# Patient Record
Sex: Female | Born: 1970 | Race: Asian | Hispanic: No | Marital: Married | Smoking: Never smoker
Health system: Southern US, Community
[De-identification: ages and names within clinical notes are randomized; demographics above are authoritative.]

## PROBLEM LIST (undated history)

## (undated) DIAGNOSIS — M797 Fibromyalgia: Secondary | ICD-10-CM

## (undated) DIAGNOSIS — R519 Headache, unspecified: Secondary | ICD-10-CM

## (undated) DIAGNOSIS — R51 Headache: Secondary | ICD-10-CM

## (undated) HISTORY — DX: Fibromyalgia: M79.7

## (undated) HISTORY — DX: Headache, unspecified: R51.9

## (undated) HISTORY — DX: Headache: R51

---

## 2013-11-16 ENCOUNTER — Other Ambulatory Visit: Payer: Self-pay

## 2013-11-16 DIAGNOSIS — Z1231 Encounter for screening mammogram for malignant neoplasm of breast: Secondary | ICD-10-CM

## 2013-12-02 ENCOUNTER — Ambulatory Visit
Admission: RE | Admit: 2013-12-02 | Discharge: 2013-12-02 | Disposition: A | Discharge status: Home / Self Care | Payer: PRIVATE HEALTH INSURANCE | Source: Ambulatory Visit

## 2013-12-02 DIAGNOSIS — Z1231 Encounter for screening mammogram for malignant neoplasm of breast: Secondary | ICD-10-CM

## 2014-01-03 DIAGNOSIS — G43909 Migraine, unspecified, not intractable, without status migrainosus: Secondary | ICD-10-CM | POA: Insufficient documentation

## 2014-01-03 DIAGNOSIS — IMO0002 Reserved for concepts with insufficient information to code with codable children: Secondary | ICD-10-CM | POA: Insufficient documentation

## 2014-04-14 ENCOUNTER — Other Ambulatory Visit: Payer: Self-pay | Admitting: Neurology

## 2014-04-14 ENCOUNTER — Encounter: Payer: Self-pay | Admitting: Neurology

## 2014-04-14 ENCOUNTER — Ambulatory Visit (INDEPENDENT_AMBULATORY_CARE_PROVIDER_SITE_OTHER): Payer: PRIVATE HEALTH INSURANCE | Admitting: Neurology

## 2014-04-14 VITALS — BP 108/66 | HR 78 | Resp 14 | Ht 60.0 in | Wt 123.0 lb

## 2014-04-14 DIAGNOSIS — F988 Other specified behavioral and emotional disorders with onset usually occurring in childhood and adolescence: Secondary | ICD-10-CM

## 2014-04-14 DIAGNOSIS — F909 Attention-deficit hyperactivity disorder, unspecified type: Secondary | ICD-10-CM

## 2014-04-14 DIAGNOSIS — R569 Unspecified convulsions: Secondary | ICD-10-CM

## 2014-04-14 DIAGNOSIS — G43109 Migraine with aura, not intractable, without status migrainosus: Secondary | ICD-10-CM

## 2014-04-14 DIAGNOSIS — F329 Major depressive disorder, single episode, unspecified: Secondary | ICD-10-CM

## 2014-04-14 DIAGNOSIS — F32A Depression, unspecified: Secondary | ICD-10-CM

## 2014-04-14 MED ORDER — ZONISAMIDE 100 MG PO CAPS: 100.0000 mg | ORAL_CAPSULE | Freq: Every day | ORAL | Status: DC

## 2014-04-14 MED ORDER — RIZATRIPTAN BENZOATE 10 MG PO TABS: 10.0000 mg | ORAL_TABLET | ORAL | Status: DC | PRN

## 2014-04-14 MED ORDER — FROVATRIPTAN SUCCINATE 2.5 MG PO TABS: ORAL_TABLET | ORAL | Status: DC

## 2014-04-14 NOTE — Procedures (Signed)
    History:  Maureen Moore is a 44 year old patient with a history of a witnessed seizure that occurred approximately 5 days prior to this evaluation. The episode was a generalized event. The patient had not gotten much rest prior to the seizure event. No prior history of seizures. MRI of the brain was unremarkable. The patient is being evaluated for the seizure episode.  This is a routine EEG. No skull defects are noted. Medications include baclofen, Zyrtec, dexmethylphenidate, Frova, Neurontin, Zofran, Maxalt, Zomig, and Zonegran. The patient had recently been on Wellbutrin.   EEG classification: Essentially normal awake  Description of the recording: The background rhythms of this recording consists of a fairly well modulated medium amplitude alpha rhythm of 10 Hz that is reactive to eye opening and closure. As the record progresses, the patient appears to remain in the waking state throughout the recording. Photic stimulation was performed, resulting in a bilateral and symmetric photic driving response. Hyperventilation was also performed, resulting in a minimal buildup of the background rhythm activities without significant slowing seen. There appears to be frequent head movement and muscle artifact, and some electrode artifact associated with movement. This is seen frequently throughout the recording. At no time during the recording does there appear to be evidence of spike or spike wave discharges or evidence of focal slowing. EKG monitor shows no evidence of cardiac rhythm abnormalities with a heart rate of 78.  Impression: This is an essentially normal EEG recording in the waking state. No evidence of ictal or interictal discharges are seen. The recording was technically difficult secondary to movement and muscle artifact. The patient was using her cell phone throughout the recording, creating artifact.

## 2014-04-14 NOTE — Progress Notes (Addendum)
[facility]  PATIENT: Maureen Moore DOB: 09/18/1970  REFERRING CLINICIAN: Brooke Bonito HISTORY FROM: patient REASON FOR VISIT: First seizure   HISTORICAL  CHIEF COMPLAINT:  Chief Complaint  Patient presents with  . Seizures    Husband sts. on 04-10-14 pt had first time sz like activity---full body jerking lasting approx 3 min. with confusion, lethargy afterwards.  He took her to [facility] for tx.  Sts. CT head, mri brain were negaitive.  No EEG done at that time.  She is on Topamax  bid.  She is also on Gabapentin  qhs./fim    HISTORY OF PRESENT ILLNESS:  Maureen Moore is a 44 year old woman who had the first seizure of her life about 5 days ago. She has slept poorly the previous night with about 4 or 4-1/2 hours split over a few segments. She was up about 4 hours when she had the seizure. Her husband heard a thud when she fell and went into the room. He witnessed her seizing for probably about 90 seconds. He described a generalized tonic-clonic seizure. There was no loss of bladder or bowel function. There was no tongue biting. Afterwards, she was groggy, confused and generally weak for a while. She went to [facility] emergency room. She had blood work which was essentially normal with just a mildly elevated white blood cell count.  A CT scan of the head and spine and an MRI of the head were all normal.  This was the first seizure of her life. Her son also has had one generalized tonic-clonic seizure. He has autism. There is no other family history of seizures.  She denies any birth injury or major head injury.   She denies any unusual activity or injury in the preceding week. She had noted over the preceding few months that she would be a little bit more confused and less focused,  getting lost at times while driving.  A month or two earlier, she had been on a higher dose of topiramate  that was being reduced due to cognitive fog as a side  effect. Gabapentin was added. She is also on Wellbutrin, though that has been tapered from 300 to 75 mg since the seizure. She was started on dexmethylphenidate a couple months ago.      Her attention deficit and cognitive issues and focus improved on dexmethylphenidate.  She has a history of frequent migraine headaches. She has been on multiple prophylactic medications for the migraines in the past. Topiramate at high dosages helped but she had difficulty tolerating it.  Keppra was not well tolerated.   She has not been on zonisamide or depakote.    Gabapentin has been added only 3 months ago at low dose.   She gets migraines for 4 or 5 days a month, usually clustered around her period. When headache occurs, it is usually unilateral. Sometimes gets visual aura.   She gets associated photophobia and phonophobia. Moving worsens the headache. Typically, she will take a Zomig. If that is not beneficial she takes a baclofen. At time she had used Tylenol 3 in the past. In the past, she was having rebound headaches when she took more of the Tylenol 3 so is trying to reserve it.    She has insomnia some nights and did not note much difference when gabapentin 300 mg was added.       REVIEW OF SYSTEMS:  Constitutional: No fevers, chills, sweats, or change in appetite Eyes: No visual changes,  double vision, eye pain Ear, nose and throat: No hearing loss, ear pain, nasal congestion, sore throat Cardiovascular: No chest pain, palpitations Respiratory:  No shortness of breath at rest or with exertion.   No wheezes GastrointestinaI: No nausea, vomiting, diarrhea, abdominal pain, fecal incontinence Genitourinary:  No dysuria, urinary retention or frequency.  No nocturia. Musculoskeletal:  No neck pain, back pain Integumentary: No rash, pruritus, skin lesions.  Photosensitive to sulfa drugs.    Neurological: as above Psychiatric: Mood was doing well on wellbutrin. Endocrine: No palpitations, diaphoresis,  change in appetite, change in weigh or increased thirst Hematologic/Lymphatic:  No anemia, purpura, petechiae. Allergic/Immunologic: She has seasonal allerigies.   No rashes  ALLERGIES: Allergies  Allergen Reactions  . Sulfa Antibiotics     HOME MEDICATIONS:  Current outpatient prescriptions:  .  acetaminophen-codeine (TYLENOL #3) 300-30 MG per tablet, Take by mouth., Disp: , Rfl:  .  baclofen (LIORESAL) 10 MG tablet, , Disp: , Rfl: 0 .  buPROPion (WELLBUTRIN) 75 MG tablet, , Disp: , Rfl: 0 .  cetirizine (ZYRTEC) 10 MG tablet, Take by mouth., Disp: , Rfl:  .  Dexmethylphenidate HCl 40 MG CP24, , Disp: , Rfl: 0 .  gabapentin (NEURONTIN) 300 MG capsule, , Disp: , Rfl: 2 .  neomycin-polymyxin-gramicidin (NEOSPORIN) 1.75-10000-.025 ophthalmic solution, , Disp: , Rfl: 0 .  ondansetron (ZOFRAN-ODT) 4 MG disintegrating tablet, , Disp: , Rfl: 1 .  rizatriptan (MAXALT) 10 MG tablet, Take by mouth., Disp: , Rfl:  .  topiramate (TOPAMAX) 25 MG tablet, Take by mouth., Disp: , Rfl:  .  XULANE 150-35 MCG/24HR transdermal patch, , Disp: , Rfl: 13 .  ZOMIG 5 MG nasal solution, , Disp: , Rfl: 2   PAST MEDICAL HISTORY: Past Medical History  Diagnosis Date  . Headache   . Fibromyalgia     PAST SURGICAL HISTORY: History reviewed. No pertinent past surgical history.  FAMILY HISTORY: History reviewed. No pertinent family history. Son had one seizure  SOCIAL HISTORY:  History   Social History  . Marital Status: Married    Spouse Name: N/A    Number of Children: N/A  . Years of Education: N/A   Occupational History  . Not on file.   Social History Main Topics  . Smoking status: Never Smoker   . Smokeless tobacco: Not on file  . Alcohol Use: 0.0 oz/week    0 Not specified per week     Comment: social  . Drug Use: No  . Sexual Activity: Not on file   Other Topics Concern  . Not on file   Social History Narrative  . No narrative on file     PHYSICAL EXAM  Filed Vitals:     04/14/14 0938  BP: 108/66  Pulse: 78  Resp: 14  Height: 5' (1.524 m)  Weight: 123 lb (55.792 kg)    Body mass index is 24.02 kg/(m^2).   General: The patient is well-developed and well-nourished and in no acute distress  Eyes:  Funduscopic exam shows normal optic discs and retinal vessels.  Neck: The neck is supple, no carotid bruits are noted.  The neck is nontender.  Respiratory: The respiratory examination is clear.  Cardiovascular: The cardiovascular examination reveals a regular rate and rhythm, no murmurs, gallops or rubs are noted.  Skin: Extremities are without significant edema.  Neurologic Exam  Mental status: The patient is alert and oriented x 3 at the time of the examination. The patient has apparent normal recent and remote memory,  with an apparently normal attention span and concentration ability.   Speech is normal.  Cranial nerves: Extraocular movements are full. Pupils are equal, round, and reactive to light and accomodation.  Visual fields are full.  Facial symmetry is present. There is good facial sensation to soft touch bilaterally.Facial strength is normal.  Trapezius and sternocleidomastoid strength is normal. No dysarthria is noted.  The tongue is midline, and the patient has symmetric elevation of the soft palate. No obvious hearing deficits are noted.  Motor:  Muscle bulk and tone are normal. Strength is  5 / 5 in all 4 extremities.   Sensory: Sensory testing is intact to pinprick, soft touch, vibration sensation, and position sense on all 4 extremities.  Coordination: Cerebellar testing reveals good finger-nose-finger and heel-to-shin bilaterally.  Gait and station: Station and gait are normal. Tandem gait is normal. Romberg is negative.   Reflexes: Deep tendon reflexes are symmetric and normally brisk bilaterally. Plantar responses are normal.    DIAGNOSTIC DATA (LABS, IMAGING, TESTING) - I reviewed patient records, labs, notes, testing and  imaging myself where available.  EEG performed today showed no definite epileptiform activity.   She had movement artifact.    ASSESSMENT AND PLAN   Convulsions, unspecified convulsion type  Attention deficit disorder  Depression  Migraine with aura and without status migrainosus, not intractable   In summary, Maureen Moore is a 44 year old woman who had the first seizure of her life 5 days ago and is completely back to baseline. Her seizure occurred in the setting of 300 mg of daily Wellbutrin and tapering topiramate and possibly mild sleep deprivation. EEG today was normal. We discussed that it is very possible that she would never have another seizure as this one may have been triggered by the medications and poor sleep. All of her, we cannot be certain and therefore she should drive for several months. I have asked her to stop the Wellbutrin. I will add is zonisamide as it is often similar to Topamax in efficacy but better as far as attentional tolerability. I would also make some changes in her migraine medications. Her migraines are clustered around her period. She might benefit from taking several days in a row of Frova after her first migraine each month. She is not to take more than 2 of any triptan in one day.  She will return to see me in 3 months or sooner if she has new or worsening neurologic issues.   Marry Kusch A. Epimenio Foot, MD, PhD 04/14/2014, 9:57 AM Certified in Neurology, Clinical Neurophysiology, Sleep Medicine, Pain Medicine and Neuroimaging  [facility] [address], [address] [place], [place] [address] [phone]

## 2014-04-14 NOTE — Telephone Encounter (Signed)
Per OV note, 100mg  daily was sent

## 2014-04-21 ENCOUNTER — Telehealth: Payer: Self-pay | Admitting: Neurology

## 2014-04-21 ENCOUNTER — Other Ambulatory Visit: Payer: Self-pay | Admitting: Neurology

## 2014-04-21 MED ORDER — TOPIRAMATE 50 MG PO TABS: 50.0000 mg | ORAL_TABLET | Freq: Two times a day (BID) | ORAL | Status: DC

## 2014-04-21 NOTE — Telephone Encounter (Signed)
Having itching with zonisamide.  We will switch to Topamax.  If foggy feeling, change to  Trokendi

## 2014-05-03 ENCOUNTER — Telehealth: Payer: Self-pay | Admitting: Neurology

## 2014-05-03 ENCOUNTER — Other Ambulatory Visit: Payer: Self-pay | Admitting: Neurology

## 2014-05-03 MED ORDER — TOPIRAMATE 100 MG PO TABS: 100.0000 mg | ORAL_TABLET | Freq: Three times a day (TID) | ORAL | Status: DC

## 2014-05-03 NOTE — Telephone Encounter (Signed)
Per note on 02/11

## 2014-05-03 NOTE — Telephone Encounter (Signed)
1.  HA's not doing well on TPM 50 mg po bid -- used to be on 300 mg/day     ---   I will increase 2.   Mood worse on citalopram than wellbutrin   --- consider change to zoloft but rec'd discuss with her psychiatrist

## 2014-05-18 ENCOUNTER — Ambulatory Visit (INDEPENDENT_AMBULATORY_CARE_PROVIDER_SITE_OTHER): Payer: PRIVATE HEALTH INSURANCE | Admitting: Neurology

## 2014-05-18 ENCOUNTER — Encounter: Payer: Self-pay | Admitting: Neurology

## 2014-05-18 VITALS — BP 98/66 | HR 68 | Resp 14 | Ht 60.0 in | Wt 123.6 lb

## 2014-05-18 DIAGNOSIS — G43109 Migraine with aura, not intractable, without status migrainosus: Secondary | ICD-10-CM | POA: Diagnosis not present

## 2014-05-18 DIAGNOSIS — G4700 Insomnia, unspecified: Secondary | ICD-10-CM

## 2014-05-18 DIAGNOSIS — M797 Fibromyalgia: Secondary | ICD-10-CM | POA: Diagnosis not present

## 2014-05-18 DIAGNOSIS — R569 Unspecified convulsions: Secondary | ICD-10-CM

## 2014-05-18 DIAGNOSIS — F32A Depression, unspecified: Secondary | ICD-10-CM

## 2014-05-18 DIAGNOSIS — F329 Major depressive disorder, single episode, unspecified: Secondary | ICD-10-CM | POA: Diagnosis not present

## 2014-05-18 DIAGNOSIS — F988 Other specified behavioral and emotional disorders with onset usually occurring in childhood and adolescence: Secondary | ICD-10-CM

## 2014-05-18 DIAGNOSIS — F909 Attention-deficit hyperactivity disorder, unspecified type: Secondary | ICD-10-CM

## 2014-05-18 MED ORDER — GABAPENTIN 300 MG PO CAPS: ORAL_CAPSULE | ORAL | Status: DC

## 2014-05-18 MED ORDER — VENLAFAXINE HCL ER 150 MG PO CP24: 150.0000 mg | ORAL_CAPSULE | Freq: Every day | ORAL | Status: DC

## 2014-05-18 NOTE — Progress Notes (Signed)
[facility]  PATIENT: Maureen Moore DOB: 01-02-1971  REFERRING CLINICIAN: Brooke Bonito HISTORY FROM: patient REASON FOR VISIT: First seizure   HISTORICAL  CHIEF COMPLAINT:  Chief Complaint  Patient presents with  . Migraine    Sts. h/a'a are no better.  Sts. she is having more joint pain--mainly in shoulders, hands, elbows, hips./fim    HISTORY OF PRESENT ILLNESS:  Maureen Moore is a 44 year old woman with a single seizure 04/10/2014 and migraine headaches.  Seizure:   On 04/10/2014, she had a GTC seizure witnessed by her husband, an MD.  The seizure 04/10/2014 was in the setting of reducing topiramate dose and being on Wellbutrin and being partially sleep deprived. There was no loss of bladder or bowel function. There was no tongue biting. Afterwards, she was groggy, confused and generally weak for a while.   CT and MRI 04/10/2014 were normal. An EEG last month was normal.  ADD:   Her attention deficit and cognitive issues and focus improved on dexmethylphenidate.   She stopped after the seizure  Migraine:   She has a history of frequent migraine headaches.  She gets migraines for 4 or 5 days a month, usually clustered around her period. When headache occurs, it is usually unilateral. Sometimes gets visual aura.   She gets associated photophobia and phonophobia. Moving worsens the headache. Typically, she will take a Zomig. If that is not beneficial she takes a baclofen.   She has been on multiple prophylactic medications for the migraines in the past. Topiramate at high dosages helped but she had difficulty tolerating it.  Keppra was not well tolerated.  I had her try zonisamide but it caused a rash so she was placed back on topiramate and titrated up to 300 mg daily.       Insomnia:   She has insomnia some nights and did not note much difference when gabapentin 300 mg was added.    She has trouble falling asleep and often has a hypnic start right as she falls  asleep,    She also has nocturia.  She talks in her sleep.     She does not snore.     Fibromyalgia:   She has had more myalgias over the past few months.   Pain is fairly symmetric and located in upper back, chest, arms and legs.   Joints seem stiff, especially the hands and left elbow and shoulders.    She has never been told she has FMS but has had pain like this off/on x many years.    About 13 years ago, she had similar pain and was placed in an antidepressant with improvement (citalopram).  After a while, pain worsened again but then improved after changes in antidepressant.     REVIEW OF SYSTEMS:  Constitutional: No fevers, chills, sweats, or change in appetite.   Eyes: No visual changes, double vision, eye pain Ear, nose and throat: No hearing loss, ear pain, nasal congestion, sore throat Cardiovascular: No chest pain, palpitations Respiratory:  Spome coughing and mild shortness of breath.   No wheezes GastrointestinaI: No nausea, vomiting, diarrhea, abdominal pain, fecal incontinence Genitourinary:  No dysuria, urinary retention or frequency.  She notes nocturia. Musculoskeletal:  Pain in neck, back and many large muscles and some joints Integumentary: No rash, pruritus, skin lesions.  Photosensitive to sulfa drugs.    Neurological: as above Psychiatric: Some depression.   Better on citalopram. Endocrine: No palpitations, diaphoresis, change in appetite, some cold intolerance Hematologic/Lymphatic:  No anemia, purpura, petechiae. Allergic/Immunologic: She has seasonal allerigies.   No rashes  ALLERGIES: Allergies  Allergen Reactions  . Sulfa Antibiotics     HOME MEDICATIONS:  Current outpatient prescriptions:  .  albuterol (PROVENTIL HFA;VENTOLIN HFA) 108 (90 BASE) MCG/ACT inhaler, Inhale into the lungs every 6 (six) hours as needed for wheezing or shortness of breath., Disp: , Rfl:  .  citalopram (CELEXA) 20 MG tablet, Take 20 mg by mouth 3 (three) times daily., Disp: , Rfl:    .  fexofenadine (ALLEGRA) 30 MG tablet, Take 30 mg by mouth 2 (two) times daily., Disp: , Rfl:  .  fluticasone (FLONASE) 50 MCG/ACT nasal spray, Place into both nostrils daily., Disp: , Rfl:  .  ondansetron (ZOFRAN-ODT) 4 MG disintegrating tablet, , Disp: , Rfl: 1 .  rizatriptan (MAXALT) 10 MG tablet, Take 1 tablet (10 mg total) by mouth as needed for migraine., Disp: 10 tablet, Rfl: 11 .  topiramate (TOPAMAX) 100 MG tablet, TAKE 1 TABLET BY MOUTH THREE TIMES DAILY, Disp: 270 tablet, Rfl: 1 .  XULANE 150-35 MCG/24HR transdermal patch, , Disp: , Rfl: 13 .  ZOMIG 5 MG nasal solution, , Disp: , Rfl: 2 .  acetaminophen-codeine (TYLENOL #3) 300-30 MG per tablet, Take by mouth., Disp: , Rfl:  .  baclofen (LIORESAL) 10 MG tablet, , Disp: , Rfl: 0 .  cetirizine (ZYRTEC) 10 MG tablet, Take by mouth., Disp: , Rfl:  .  Dexmethylphenidate HCl 40 MG CP24, , Disp: , Rfl: 0 .  frovatriptan (FROVA) 2.5 MG tablet, Take po at onset of migraines and daily x 5 more days to avoid rebound (Patient not taking: Reported on 05/18/2014), Disp: 10 tablet, Rfl: 0 .  gabapentin (NEURONTIN) 300 MG capsule, , Disp: , Rfl: 2 .  neomycin-polymyxin-gramicidin (NEOSPORIN) 1.75-10000-.025 ophthalmic solution, , Disp: , Rfl: 0   PAST MEDICAL HISTORY: Past Medical History  Diagnosis Date  . Headache   . Fibromyalgia     PAST SURGICAL HISTORY: History reviewed. No pertinent past surgical history.  FAMILY HISTORY: History reviewed. No pertinent family history. Son had one seizure  SOCIAL HISTORY:  History   Social History  . Marital Status: Married    Spouse Name: N/A  . Number of Children: N/A  . Years of Education: N/A   Occupational History  . Not on file.   Social History Main Topics  . Smoking status: Never Smoker   . Smokeless tobacco: Not on file  . Alcohol Use: 0.0 oz/week    0 Standard drinks or equivalent per week     Comment: social  . Drug Use: No  . Sexual Activity: Not on file   Other  Topics Concern  . Not on file   Social History Narrative     PHYSICAL EXAM  Filed Vitals:   05/18/14 1319  BP: 98/66  Pulse: 68  Resp: 14  Height: 5' (1.524 m)  Weight: 123 lb 9.6 oz (56.065 kg)    Body mass index is 24.14 kg/(m^2).   General: The patient is well-developed and well-nourished and in no acute distress  Neck/Musculoskeletal: The neck is supple, no carotid bruits are noted.  The neck is mildly ttender in paraspinal muscles and trapezius muscles.  Also tender over sibacromila bursae in shoulders, pectoralis muslces, prox arm and leg muscles and trochanteric bursae  Skin: Extremities are without significant edema.  Neurologic Exam  Mental status: The patient is alert and oriented x 3 at the time of the examination. The patient  has apparent normal recent and remote memory, with an apparently normal attention span and concentration ability.   Speech is normal.  Cranial nerves: Extraocular movements are full.  Facial symmetry is present. There is good facial sensation to soft touch bilaterally.Facial strength is normal.  Trapezius and sternocleidomastoid strength is normal. No dysarthria is noted.  The tongue is midline, and the patient has symmetric elevation of the soft palate. No obvious hearing deficits are noted.  Motor:  Muscle bulk and tone are normal. Strength is  5 / 5 in all 4 extremities.   Sensory: Sensory testing is intact to touch on all 4 extremities.  Coordination: Cerebellar testing reveals good finger-nose-finger.  Gait and station: Station and gait are normal. Tandem gait is normal.   Reflexes: Deep tendon reflexes are symmetric and normally brisk bilaterally.     DIAGNOSTIC DATA (LABS, IMAGING, TESTING) - I reviewed patient records, labs, notes, testing and imaging myself where available.  EEG performed today showed no definite epileptiform activity.   She had movement artifact.    ASSESSMENT AND PLAN   Migraine with aura and without  status migrainosus, not intractable  Convulsions, unspecified convulsion type  Attention deficit disorder  Depression  Insomnia  Fibromyalgia   1.   Continue Topiramate.   If she notes any speech issues/mental fog, change to Trokendi as often better tolerated form of topiramate 2.   We discussed that her combination of symptoms might also represent fibromyalagia --  Gabapentin 300-300-600 (less weight gain than Lyrica) and change citalopram to Effexor 3.   Try to get at least 6 hours sleep nightly Return to clinic in 3 months or sooner if she has new or worsening neurologic symptoms.  In summary, Ayari Calix is a 44 year old woman who had the first seizure of her life 5 days ago and is completely back to baseline. Her seizure occurred in the setting of 300 mg of daily Wellbutrin and tapering topiramate and possibly mild sleep deprivation. EEG today was normal. We discussed that it is very possible that she would never have another seizure as this one may have been triggered by the medications and poor sleep. All of her, we cannot be certain and therefore she should drive for several months. I have asked her to stop the Wellbutrin. I will add is zonisamide as it is often similar to Topamax in efficacy but better as far as attentional tolerability. I would also make some changes in her migraine medications. Her migraines are clustered around her period. She might benefit from taking several days in a row of Frova after her first migraine each month. She is not to take more than 2 of any triptan in one day.  She will return to see me in 3 months or sooner if she has new or worsening neurologic issues.   Saleen Peden A. Epimenio Foot, MD, PhD 05/18/2014, 1:37 PM Certified in Neurology, Clinical Neurophysiology, Sleep Medicine, Pain Medicine and Neuroimaging  [facility] [address] [place], [place] [address] [phone]

## 2014-05-25 ENCOUNTER — Telehealth: Payer: Self-pay | Admitting: Neurology

## 2014-05-25 MED ORDER — GABAPENTIN 300 MG PO CAPS: ORAL_CAPSULE | ORAL | Status: DC

## 2014-05-25 MED ORDER — VENLAFAXINE HCL ER 150 MG PO CP24: 150.0000 mg | ORAL_CAPSULE | Freq: Every day | ORAL | Status: DC

## 2014-05-25 NOTE — Telephone Encounter (Signed)
[facility] Batheresa [facility] [address] [facility] calling to get new Rx's for gabapentin (NEURONTIN) 300 MG capsule and venlafaxine XR (EFFEXOR-XR) 150 MG 24 hr capsule for the patient. The pharmacy states Rx's were sent to the mail order pharmacy but they never received it. Please send to [facility] on [address] Thank you.

## 2014-05-25 NOTE — Telephone Encounter (Signed)
Rx's have been sent. 

## 2014-07-13 ENCOUNTER — Ambulatory Visit: Payer: Self-pay | Admitting: Neurology

## 2014-08-16 ENCOUNTER — Ambulatory Visit: Payer: Self-pay | Admitting: Neurology

## 2014-08-18 ENCOUNTER — Ambulatory Visit (INDEPENDENT_AMBULATORY_CARE_PROVIDER_SITE_OTHER): Payer: PRIVATE HEALTH INSURANCE | Admitting: Neurology

## 2014-08-18 ENCOUNTER — Encounter: Payer: Self-pay | Admitting: Neurology

## 2014-08-18 VITALS — BP 114/76 | HR 72 | Resp 12 | Ht 60.0 in | Wt 122.6 lb

## 2014-08-18 DIAGNOSIS — G43109 Migraine with aura, not intractable, without status migrainosus: Secondary | ICD-10-CM

## 2014-08-18 DIAGNOSIS — G4700 Insomnia, unspecified: Secondary | ICD-10-CM | POA: Diagnosis not present

## 2014-08-18 DIAGNOSIS — F988 Other specified behavioral and emotional disorders with onset usually occurring in childhood and adolescence: Secondary | ICD-10-CM

## 2014-08-18 DIAGNOSIS — R569 Unspecified convulsions: Secondary | ICD-10-CM

## 2014-08-18 DIAGNOSIS — F329 Major depressive disorder, single episode, unspecified: Secondary | ICD-10-CM

## 2014-08-18 DIAGNOSIS — M797 Fibromyalgia: Secondary | ICD-10-CM | POA: Diagnosis not present

## 2014-08-18 DIAGNOSIS — F909 Attention-deficit hyperactivity disorder, unspecified type: Secondary | ICD-10-CM | POA: Diagnosis not present

## 2014-08-18 DIAGNOSIS — F32A Depression, unspecified: Secondary | ICD-10-CM

## 2014-08-18 MED ORDER — METHYLPHENIDATE HCL ER 36 MG PO TB24: 36.0000 mg | ORAL_TABLET | Freq: Every day | ORAL | Status: DC

## 2014-08-18 NOTE — Progress Notes (Signed)
[facility]  PATIENT: Maureen Moore DOB: Aug 13, 1970  REFERRING CLINICIAN: Brooke Bonito HISTORY FROM: patient REASON FOR VISIT: First seizure   HISTORICAL  CHIEF COMPLAINT:  Chief Complaint  Patient presents with  . Headaches    Sts. h/a's are overall better.  Sts. joint pain related to fibromyalgia is much improved since Effexor was added at last ov.  Sts. she is sleeping much better since Gabapentin was added at lsat ov.  Today she would like to discuss Topamax--she feels it is exacerbating ADD and would like to discuss switching to something else/fim  . Fibromyalgia    HISTORY OF PRESENT ILLNESS:  Maureen Moore is a 44 year old woman with a single seizure 04/10/2014 and migraine headaches who also has ADD, insomnia and myalgias  Migraine:   She is doing better on topiramate with only 4 or 5 days a month now.   She had a couple this week while out in the sun all day. They are usually clustered around her period. When headache occurs, they are unilateral and she sometimes gets visual aura.   She gets associated photophobia and phonophobia. Moving worsens the headache. Typically, she will take a Zomig.   She has been on multiple prophylactic medications for the migraines in the past. Topiramate has helped the most.   Keppra was not well tolerated.Zonisamide caused a rash.    Seizure:   She had the single seizure in January and has not had any further convulsions.  The seizure 04/10/2014 was in the setting of reducing topiramate dose and being on Wellbutrin and being partially sleep deprived.  CT and MRI 04/10/2014 were normal. An EEG last month was normal.  ADD:   Her attention deficit and cognitive issues and focus improved on dexmethylphenidate and Concerta but she stopped after the seizure.   She feels the ADD is much worse without a stimulant and wonders if she can go back on one.     Insomnia:   Insomnia is doing better and she feels she is sleeping well.         She talks in her sleep.     She does not snore.     Fibromyalgia?  :   She had myalgias but they improved with Gabapentin/Effexor.   Pain is symmetric and located in upper back, chest, arms and legs.        REVIEW OF SYSTEMS:  Constitutional: No fevers, chills, sweats, or change in appetite.   Eyes: No visual changes, double vision, eye pain Ear, nose and throat: No hearing loss, ear pain, nasal congestion, sore throat Cardiovascular: No chest pain, palpitations Respiratory:  Spome coughing and mild shortness of breath.   No wheezes GastrointestinaI: No nausea, vomiting, diarrhea, abdominal pain, fecal incontinence Genitourinary:  No dysuria, urinary retention or frequency.  She notes nocturia. Musculoskeletal:  Pain in neck, back and many large muscles and some joints Integumentary: No rash, pruritus, skin lesions.  Photosensitive to sulfa drugs.    Neurological: as above Psychiatric: Some depression.   Better on citalopram. Endocrine: No palpitations, diaphoresis, change in appetite, some cold intolerance Hematologic/Lymphatic:  No anemia, purpura, petechiae. Allergic/Immunologic: She has seasonal allerigies.   No rashes  ALLERGIES: Allergies  Allergen Reactions  . Sulfa Antibiotics     HOME MEDICATIONS:  Current outpatient prescriptions:  .  albuterol (PROVENTIL HFA;VENTOLIN HFA) 108 (90 BASE) MCG/ACT inhaler, Inhale into the lungs every 6 (six) hours as needed for wheezing or shortness of breath., Disp: , Rfl:  .  fluticasone (FLONASE) 50 MCG/ACT nasal spray, Place into both nostrils daily., Disp: , Rfl:  .  gabapentin (NEURONTIN) 300 MG capsule, One in am, one in evening and two at bedtime, Disp: 120 capsule, Rfl: 5 .  neomycin-polymyxin-gramicidin (NEOSPORIN) 1.75-10000-.025 ophthalmic solution, , Disp: , Rfl: 0 .  ondansetron (ZOFRAN-ODT) 4 MG disintegrating tablet, , Disp: , Rfl: 1 .  rizatriptan (MAXALT) 10 MG tablet, Take 1 tablet (10 mg total) by mouth as needed for  migraine., Disp: 10 tablet, Rfl: 11 .  topiramate (TOPAMAX) 100 MG tablet, TAKE 1 TABLET BY MOUTH THREE TIMES DAILY, Disp: 270 tablet, Rfl: 1 .  venlafaxine XR (EFFEXOR-XR) 150 MG 24 hr capsule, Take 1 capsule (150 mg total) by mouth daily with breakfast., Disp: 30 capsule, Rfl: 5 .  ZOMIG 5 MG nasal solution, , Disp: , Rfl: 2 .  fexofenadine (ALLEGRA) 30 MG tablet, Take 30 mg by mouth 2 (two) times daily., Disp: , Rfl:  .  XULANE 150-35 MCG/24HR transdermal patch, , Disp: , Rfl: 13   PAST MEDICAL HISTORY: Past Medical History  Diagnosis Date  . Headache   . Fibromyalgia     PAST SURGICAL HISTORY: History reviewed. No pertinent past surgical history.  FAMILY HISTORY: History reviewed. No pertinent family history. Son had one seizure  SOCIAL HISTORY:  History   Social History  . Marital Status: Married    Spouse Name: N/A  . Number of Children: N/A  . Years of Education: N/A   Occupational History  . Not on file.   Social History Main Topics  . Smoking status: Never Smoker   . Smokeless tobacco: Not on file  . Alcohol Use: 0.0 oz/week    0 Standard drinks or equivalent per week     Comment: social  . Drug Use: No  . Sexual Activity: Not on file   Other Topics Concern  . Not on file   Social History Narrative     PHYSICAL EXAM  Filed Vitals:   08/18/14 1133  BP: 114/76  Pulse: 72  Resp: 12  Height: 5' (1.524 m)  Weight: 122 lb 9.6 oz (55.611 kg)    Body mass index is 23.94 kg/(m^2).   General: The patient is well-developed and well-nourished and in no acute distress  Neck/Musculoskeletal: The neck is supple,  The neck is mildly tender in the paraspinal muscles and trapezius muscles.  Also tender over  pectoralis muslces, prox arm and leg muscles and trochanteric bursae    Neurologic Exam  Mental status: The patient is alert and oriented x 3 at the time of the examination. The patient has apparent normal recent and remote memory, with an apparently  normal attention span and concentration ability.   Speech is normal.  Cranial nerves: Extraocular movements are full.  Facial symmetry is present. Facial strength is normal.  Trapezius and sternocleidomastoid strength is normal. No dysarthria is noted.  The tongue is midline, and the patient has symmetric elevation of the soft palate. No obvious hearing deficits are noted.  Motor:  Muscle bulk and tone are normal. Strength is  5 / 5 in all 4 extremities.   Sensory: Sensory testing is intact to touch on all 4 extremities.  Coordination: Cerebellar testing reveals good finger-nose-finger.  Gait and station: Station and gait are normal. Tandem gait is normal.   Reflexes: Deep tendon reflexes are symmetric and normally brisk bilaterally.     DIAGNOSTIC DATA (LABS, IMAGING, TESTING) - I reviewed patient records, labs, notes, testing and  imaging myself where available.    ASSESSMENT AND PLAN   Migraine with aura and without status migrainosus, not intractable  Convulsions, unspecified convulsion type  Attention deficit disorder  Insomnia  Fibromyalgia  Depression    1.   Continue Topiramate.   If she notes any speech issues/mental fog, consider change to Trokendi  2.   Continue gabapentin and Effexor for pain and sleep.     Try to get at least 6 hours sleep nightly 3.  Concerta 36 mg daily for ADD.  I think the risk of seizures would be rather low as she got on Wellbutrin but is on Topamax  Return to clinic in 4 months or sooner if she has new or worsening neurologic symptoms.    Lamyiah Crawshaw A. Epimenio Foot, MD, PhD 08/18/2014, 11:39 AM Certified in Neurology, Clinical Neurophysiology, Sleep Medicine, Pain Medicine and Neuroimaging  [facility] [address], [address] [place], [place] [address] [phone]

## 2014-09-30 ENCOUNTER — Encounter: Payer: Self-pay | Admitting: *Deleted

## 2014-09-30 ENCOUNTER — Other Ambulatory Visit: Payer: Self-pay | Admitting: Neurology

## 2014-09-30 MED ORDER — METHYLPHENIDATE HCL ER 36 MG PO TB24: 36.0000 mg | ORAL_TABLET | Freq: Every day | ORAL | Status: DC

## 2014-09-30 NOTE — Telephone Encounter (Signed)
Request entered, forwarded to provider for approval.  

## 2014-09-30 NOTE — Progress Notes (Signed)
Ritalin rx. up front [facility]/fim 

## 2014-09-30 NOTE — Telephone Encounter (Signed)
Patient called and requested written rx. methylphenidate 36 MG PO CR tablet.. Would like to know if she can be written two? Please call and advise.

## 2014-11-16 ENCOUNTER — Telehealth: Payer: Self-pay | Admitting: Neurology

## 2014-11-16 NOTE — Telephone Encounter (Signed)
Clinical info has been sent to ins.  Request is under review Ref # [id number]

## 2014-11-16 NOTE — Telephone Encounter (Signed)
Patient called requesting that PA be done on Methylphenidate that she just recently had filled. Rx costs $250. Patient was advised by pharmacy that this medication needed PA. If Dr. Epimenio Foot does PA on medication and pharmacy is notified then they can reimburse patient.

## 2014-11-17 NOTE — Telephone Encounter (Signed)
Ins sent a response stating the patient's policy has been terminated.  I called the patient to clarify her current ins info.  Says she forgot to mention that her ins has changed.  She will obtain a copy of the new card and forward this info to [place].  Advised we will contact current ins once we have this info.  She expressed understanding.

## 2014-11-20 NOTE — Telephone Encounter (Signed)
Patient's new Ins [employer] (serviced by [employer]) has approved the request for coverage on Methylphenidate effective until 11/17/2015 Ref ID # [id number] Ref Case # [id number].

## 2014-11-21 ENCOUNTER — Other Ambulatory Visit: Payer: Self-pay | Admitting: Neurology

## 2014-12-01 ENCOUNTER — Telehealth: Payer: Self-pay | Admitting: Neurology

## 2014-12-01 MED ORDER — METHYLPHENIDATE HCL ER 36 MG PO TB24: 36.0000 mg | ORAL_TABLET | Freq: Every day | ORAL | Status: DC

## 2014-12-01 NOTE — Telephone Encounter (Signed)
Patient called to request refill of methylphenidate 36 MG PO CR tablet. Patient would like to get 2 or 3 Rx's of this at a time (1 for now, 2 postdated).

## 2014-12-01 NOTE — Telephone Encounter (Signed)
Per RAS ok, rx. for now plus 2 postdated rx's for October, November, printed, signed, up front [facility]/fim

## 2014-12-02 ENCOUNTER — Other Ambulatory Visit: Payer: Self-pay

## 2014-12-02 DIAGNOSIS — Z1231 Encounter for screening mammogram for malignant neoplasm of breast: Secondary | ICD-10-CM

## 2014-12-13 ENCOUNTER — Ambulatory Visit
Admission: RE | Admit: 2014-12-13 | Discharge: 2014-12-13 | Disposition: A | Discharge status: Home / Self Care | Payer: PRIVATE HEALTH INSURANCE | Source: Ambulatory Visit

## 2014-12-13 DIAGNOSIS — Z1231 Encounter for screening mammogram for malignant neoplasm of breast: Secondary | ICD-10-CM

## 2014-12-16 ENCOUNTER — Other Ambulatory Visit: Payer: Self-pay | Admitting: Neurology

## 2014-12-19 ENCOUNTER — Encounter: Payer: Self-pay | Admitting: Neurology

## 2014-12-19 ENCOUNTER — Ambulatory Visit (INDEPENDENT_AMBULATORY_CARE_PROVIDER_SITE_OTHER): Payer: PRIVATE HEALTH INSURANCE | Admitting: Neurology

## 2014-12-19 VITALS — BP 116/82 | HR 66 | Resp 12 | Ht 60.0 in | Wt 123.2 lb

## 2014-12-19 DIAGNOSIS — F988 Other specified behavioral and emotional disorders with onset usually occurring in childhood and adolescence: Secondary | ICD-10-CM

## 2014-12-19 DIAGNOSIS — F32A Depression, unspecified: Secondary | ICD-10-CM

## 2014-12-19 DIAGNOSIS — F909 Attention-deficit hyperactivity disorder, unspecified type: Secondary | ICD-10-CM

## 2014-12-19 DIAGNOSIS — M797 Fibromyalgia: Secondary | ICD-10-CM | POA: Diagnosis not present

## 2014-12-19 DIAGNOSIS — G43109 Migraine with aura, not intractable, without status migrainosus: Secondary | ICD-10-CM | POA: Diagnosis not present

## 2014-12-19 DIAGNOSIS — G4700 Insomnia, unspecified: Secondary | ICD-10-CM

## 2014-12-19 DIAGNOSIS — R569 Unspecified convulsions: Secondary | ICD-10-CM

## 2014-12-19 DIAGNOSIS — F329 Major depressive disorder, single episode, unspecified: Secondary | ICD-10-CM

## 2014-12-19 MED ORDER — GABAPENTIN 300 MG PO CAPS: ORAL_CAPSULE | ORAL | Status: DC

## 2014-12-19 MED ORDER — TOPIRAMATE 100 MG PO TABS: 100.0000 mg | ORAL_TABLET | Freq: Three times a day (TID) | ORAL | Status: DC

## 2014-12-19 MED ORDER — RIZATRIPTAN BENZOATE 10 MG PO TABS: 10.0000 mg | ORAL_TABLET | ORAL | Status: DC | PRN

## 2014-12-19 MED ORDER — ONDANSETRON 4 MG PO TBDP: 4.0000 mg | ORAL_TABLET | Freq: Two times a day (BID) | ORAL | Status: DC | PRN

## 2014-12-19 MED ORDER — PHENTERMINE HCL 37.5 MG PO CAPS: 37.5000 mg | ORAL_CAPSULE | ORAL | Status: DC

## 2014-12-19 NOTE — Progress Notes (Signed)
[facility]  PATIENT: Maureen Moore DOB: 07/13/1970  REFERRING CLINICIAN: Brooke Bonito HISTORY FROM: patient REASON FOR VISIT: First seizure   HISTORICAL  CHIEF COMPLAINT:  Chief Complaint  Patient presents with  . Migraines    Sts. she stopped her birth control pills one month ago, so h/a's have been more frequent, but are less severe than they used to be since she is taking Topamax.  Sts. she takes Gabapentin  at hs--sts. her husband told her she talks alot in her sleep, has vivid dreams, and had one episode where her husband told her she was hyperventilating, he had to shake her to wake her up, and that she was difficult to wake.  Sts. she stopped taking Concerta due to cost./fim  . ADD  . Insomnia    HISTORY OF PRESENT ILLNESS:  Maureen Moore is a 44 year old woman with a single seizure 04/10/2014 and migraine headaches who also has ADD, insomnia and myalgias.   Since the last visit, she has noted more pain, mostly in the trunk and shoulders.   She was diagnosed with fibromyalgia in the past.     Fibromyalgia:   She had myalgias that initially improved with Gabapentin/Effexor.  The worse pain is in the trunk and shoulders.   Also has some pain in the arms and legs.   Migraine:   She is on topiramate with only 4 or 5 days a month now.    They were clustered around her period but off her OCP, she is actually doing better.   When headache occurs, they are unilateral and she sometimes gets visual aura.   She gets associated photophobia and phonophobia. Moving worsens the headache. Typically, she will take a Zomig.   She has been on multiple prophylactic medications for the migraines in the past. Topiramate has helped the most.   Keppra was not well tolerated.Zonisamide caused a rash.    Seizure:   She has not had any more seizures.    She had the single seizure in January and has not had any further convulsions.  The seizure 04/10/2014 was in the setting of  reducing topiramate dose and being on Wellbutrin and being partially sleep deprived.  CT and MRI 04/10/2014 were normal. An EEG last month was normal.  ADD:   Her attention deficit and cognitive issues and focus improved on dexmethylphenidate and Concerta but she stopped after the seizure.  ADD is worse without a stimulant but insurance will not cover.      Active dreams:   She sometimes talks in her sleep and occasionally yells.   She does not get out of bed.Marland Kitchen   Her husband noted an episode where she stopped breathing for a short period  Mood:   She has felt a lot more stress lately and often worries about her kids (both are special needs).      She feels she has no energy.    Insomnia:   Insomnia is doing better and she feels she is sleeping well.        She talks in her sleep.     She does not snore.     Other:   She is concerned about 20 pound weight loss over the past year.   She does not feel she is eating any more than usual.     REVIEW OF SYSTEMS:  Constitutional: No fevers, chills, sweats, or change in appetite.   Eyes: No visual changes, double vision, eye pain Ear, nose and  throat: No hearing loss, ear pain, nasal congestion, sore throat Cardiovascular: No chest pain, palpitations Respiratory:  Spome coughing and mild shortness of breath.   No wheezes GastrointestinaI: No nausea, vomiting, diarrhea, abdominal pain, fecal incontinence Genitourinary:  No dysuria, urinary retention or frequency.  She notes nocturia. Musculoskeletal:  Pain in neck, back and many large muscles and some joints Integumentary: No rash, pruritus, skin lesions.  Photosensitive to sulfa drugs.    Neurological: as above Psychiatric: Some depression.   Better on citalopram. Endocrine: No palpitations, diaphoresis, change in appetite, some cold intolerance Hematologic/Lymphatic:  No anemia, purpura, petechiae. Allergic/Immunologic: She has seasonal allerigies.   No rashes  ALLERGIES: Allergies  Allergen  Reactions  . Sulfa Antibiotics     HOME MEDICATIONS:  Current outpatient prescriptions:  .  fexofenadine (ALLEGRA) 30 MG tablet, Take 30 mg by mouth 2 (two) times daily., Disp: , Rfl:  .  fluticasone (FLONASE) 50 MCG/ACT nasal spray, Place into both nostrils daily., Disp: , Rfl:  .  gabapentin (NEURONTIN) 300 MG capsule, One in am, one in evening and two at bedtime, Disp: 120 capsule, Rfl: 5 .  ondansetron (ZOFRAN-ODT) 4 MG disintegrating tablet, , Disp: , Rfl: 1 .  rizatriptan (MAXALT) 10 MG tablet, TAKE 1 TABLET BY MOUTH AS NEEDED FOR MIGRAINE, Disp: 10 tablet, Rfl: 6 .  topiramate (TOPAMAX) 100 MG tablet, TAKE 1 TABLET BY MOUTH THREE TIMES DAILY, Disp: 270 tablet, Rfl: 1 .  venlafaxine XR (EFFEXOR-XR) 150 MG 24 hr capsule, Take 1 capsule (150 mg total) by mouth daily with breakfast., Disp: 30 capsule, Rfl: 5 .  albuterol (PROVENTIL HFA;VENTOLIN HFA) 108 (90 BASE) MCG/ACT inhaler, Inhale into the lungs every 6 (six) hours as needed for wheezing or shortness of breath., Disp: , Rfl:  .  XULANE 150-35 MCG/24HR transdermal patch, , Disp: , Rfl: 13   PAST MEDICAL HISTORY: Past Medical History  Diagnosis Date  . Headache   . Fibromyalgia     PAST SURGICAL HISTORY: History reviewed. No pertinent past surgical history.  FAMILY HISTORY: History reviewed. No pertinent family history. Son had one seizure  SOCIAL HISTORY:  Social History   Social History  . Marital Status: Married    Spouse Name: N/A  . Number of Children: N/A  . Years of Education: N/A   Occupational History  . Not on file.   Social History Main Topics  . Smoking status: Never Smoker   . Smokeless tobacco: Not on file  . Alcohol Use: 0.0 oz/week    0 Standard drinks or equivalent per week     Comment: social  . Drug Use: No  . Sexual Activity: Not on file   Other Topics Concern  . Not on file   Social History Narrative     PHYSICAL EXAM  Filed Vitals:   12/19/14 1025  BP: 116/82  Pulse: 66    Resp: 12  Height: 5' (1.524 m)  Weight: 123 lb 3.2 oz (55.883 kg)    Body mass index is 24.06 kg/(m^2).   General: The patient is well-developed and well-nourished and in no acute distress  Neck/Musculoskeletal: The neck is supple,  The neck is mildly tender in the paraspinal muscles and trapezius muscles.  Also tender over  pectoralis muslces, prox arm and leg muscles and trochanteric bursae    Neurologic Exam  Mental status: The patient is alert and oriented x 3 at the time of the examination. The patient has apparent normal recent and remote memory, with an apparently  normal attention span and concentration ability.   Speech is normal.  Cranial nerves: Extraocular movements are full.  Facial symmetry is present. Facial strength is normal.  Trapezius and sternocleidomastoid strength is normal. No dysarthria is noted.  The tongue is midline, and the patient has symmetric elevation of the soft palate. No obvious hearing deficits are noted.  Motor:  Muscle bulk and tone are normal. Strength is  5 / 5 in all 4 extremities.   Sensory: Sensory testing is intact to touch on all 4 extremities.  Coordination: Cerebellar testing reveals good finger-nose-finger.  Gait and station: Station and gait are normal. Tandem gait is normal.   Reflexes: Deep tendon reflexes are symmetric and normally brisk bilaterally.     DIAGNOSTIC DATA (LABS, IMAGING, TESTING) - I reviewed patient records, labs, notes, testing and imaging myself where available.    ASSESSMENT AND PLAN   Convulsions, unspecified convulsion type (HCC)  Fibromyalgia  Attention deficit disorder  Depression  Insomnia  Migraine with aura and without status migrainosus, not intractable    1.   Continue Topiramate.   If she notes any speech issues/mental fog, consider change to Trokendi  2.   Continue gabapentin and Effexor for pain.   Gabapentin will also help sleep.     Try to get at least 6 hours sleep nightly 3.   Phentermine as a stimulant that may also help her weight concerns.   I think the risk of seizures would be rather low as she is on Topamax and gabapentin  Return to clinic in 4 months or sooner if she has new or worsening neurologic symptoms.    Richard A. Epimenio Foot, MD, PhD 12/19/2014, 10:30 AM Certified in Neurology, Clinical Neurophysiology, Sleep Medicine, Pain Medicine and Neuroimaging  [facility] [address] [place], [place] [address] [phone]

## 2014-12-21 ENCOUNTER — Telehealth: Payer: Self-pay | Admitting: Neurology

## 2014-12-21 ENCOUNTER — Other Ambulatory Visit: Payer: Self-pay | Admitting: Neurology

## 2014-12-21 NOTE — Telephone Encounter (Signed)
Patient called to request refill of venlafaxine XR (EFFEXOR-XR) 150 MG 24 hr capsule

## 2014-12-21 NOTE — Telephone Encounter (Signed)
Rx has already ben sent.

## 2015-05-22 ENCOUNTER — Ambulatory Visit: Payer: Self-pay | Admitting: Neurology

## 2015-05-23 ENCOUNTER — Encounter: Payer: Self-pay | Admitting: Neurology

## 2015-05-23 ENCOUNTER — Ambulatory Visit (INDEPENDENT_AMBULATORY_CARE_PROVIDER_SITE_OTHER): Payer: PRIVATE HEALTH INSURANCE | Admitting: Neurology

## 2015-05-23 ENCOUNTER — Telehealth: Payer: Self-pay | Admitting: Neurology

## 2015-05-23 VITALS — BP 110/72 | HR 72 | Resp 14 | Ht 60.0 in | Wt 116.2 lb

## 2015-05-23 DIAGNOSIS — F32A Depression, unspecified: Secondary | ICD-10-CM

## 2015-05-23 DIAGNOSIS — G43009 Migraine without aura, not intractable, without status migrainosus: Secondary | ICD-10-CM

## 2015-05-23 DIAGNOSIS — F988 Other specified behavioral and emotional disorders with onset usually occurring in childhood and adolescence: Secondary | ICD-10-CM

## 2015-05-23 DIAGNOSIS — G4700 Insomnia, unspecified: Secondary | ICD-10-CM

## 2015-05-23 DIAGNOSIS — R569 Unspecified convulsions: Secondary | ICD-10-CM | POA: Diagnosis not present

## 2015-05-23 DIAGNOSIS — F329 Major depressive disorder, single episode, unspecified: Secondary | ICD-10-CM

## 2015-05-23 DIAGNOSIS — M797 Fibromyalgia: Secondary | ICD-10-CM | POA: Diagnosis not present

## 2015-05-23 DIAGNOSIS — F909 Attention-deficit hyperactivity disorder, unspecified type: Secondary | ICD-10-CM | POA: Diagnosis not present

## 2015-05-23 MED ORDER — PHENTERMINE HCL 37.5 MG PO CAPS: 37.5000 mg | ORAL_CAPSULE | ORAL | Status: DC

## 2015-05-23 MED ORDER — DESVENLAFAXINE ER 100 MG PO TB24
100.0000 mg | ORAL_TABLET | Freq: Every day | ORAL | Status: DC
Start: 2015-05-23 — End: 2015-07-26

## 2015-05-23 NOTE — Telephone Encounter (Signed)
I have spoken with pharmacist. Rx. did not say "dispense as written" so pt. can have brand or generic, whichever ins. prefers/fim

## 2015-05-23 NOTE — Progress Notes (Signed)
[facility]  PATIENT: Maureen Moore DOB: 02/28/1971  REFERRING CLINICIAN: Brooke BonitoWarren Gallemore HISTORY FROM: patient REASON FOR VISIT: First seizure   HISTORICAL  CHIEF COMPLAINT:  Chief Complaint  Patient presents with  . Seizures  . Migraines    Sts. migraines have been worse since implanted birth control.  She has made an appt. with her ob/gyn to have implant removed.  Sts. FMS has been worse in colder weather.  She would like discuss restarting Celexa/fim  . ADD    HISTORY OF PRESENT ILLNESS:  Maureen Moore is a 45 year old woman with a single seizure 04/10/2014, migraine headaches,  ADD, insomnia and myalgias.   Since the last visit, she is noting more stress with some family health issues.   has noted more pain, mostly in the trunk and shoulders.   She was diagnosed with fibromyalgia in the past.     Fibromyalgia:   She had myalgias that initially improved with Gabapentin/Effexor.  The worse pain is in the trunk and shoulders.   Also has some pain in the arms and legs.   Migraine:   She is on topiramate with only 4 or 5 days a month now.    Since getting hormonal implant recently, she is experiencing more migraines than before.  They were especially bad x 1 week.     In the past, migraines were clustered around her period.   When headache occurs, they are unilateral (either side) and she sometimes gets visual aura.   She gets associated photophobia and phonophobia. Moving worsens the headache. Typically, she will take a Zomig.   She has been on multiple prophylactic medications for the migraines in the past. Topiramate has helped the most but she is concerned it has caused a brain fog.     Keppra was not well tolerated.  Zonisamide caused a rash.    Seizure:   She has not had any more seizures.    She had the single seizure in January and has not had any further convulsions.  The seizure 04/10/2014 was in the setting of reducing topiramate dose and being on  Wellbutrin and being partially sleep deprived.  CT and MRI 04/10/2014 were normal. An EEG last month was normal.  ADD:   Her attention deficit and cognitive issues and focus improved on dexmethylphenidate.    Concerta was not tolerated as well..  ADD is worse without a stimulant but insurance will not cover.    Nuvigil helped her the most and also helped her sleepiness.   Phentermine has helped sleepiness but not the ADD.    Mood:   She has felt a lot more stress lately and often worries about her kids (both are special needs).      She feels she has no energy.    Insomnia:   Insomnia is often a problem and she is sleepy the next day if she does not sleep well.        She sometimes talks in her sleep and occasionally yells.   She does not get out of bed..   She does not snore.       REVIEW OF SYSTEMS:  Constitutional: No fevers, chills, sweats, or change in appetite.   Sleepiness, insomnia   Eyes: No visual changes, double vision, eye pain Ear, nose and throat: No hearing loss, ear pain, nasal congestion, sore throat Cardiovascular: No chest pain, palpitations Respiratory:  Spome coughing and mild shortness of breath.   No wheezes GastrointestinaI: No nausea, vomiting,  diarrhea, abdominal pain, fecal incontinence Genitourinary:  No dysuria, urinary retention or frequency.  She notes nocturia. Musculoskeletal:  Pain in neck, back and many large muscles and some joints Integumentary: No rash, pruritus, skin lesions.  Photosensitive to sulfa drugs.    Neurological: as above Psychiatric: Some depression.   Better on citalopram. Endocrine: No palpitations, diaphoresis, change in appetite, some cold intolerance Hematologic/Lymphatic:  No anemia, purpura, petechiae. Allergic/Immunologic: She has seasonal allerigies.   No rashes  ALLERGIES: Allergies  Allergen Reactions  . Sulfa Antibiotics     HOME MEDICATIONS:  Current outpatient prescriptions:  .  fexofenadine (ALLEGRA) 30 MG tablet,  Take 30 mg by mouth 2 (two) times daily., Disp: , Rfl:  .  fluticasone (FLONASE) 50 MCG/ACT nasal spray, Place into both nostrils daily., Disp: , Rfl:  .  gabapentin (NEURONTIN) 300 MG capsule, One in am, one in evening and two at bedtime, Disp: 360 capsule, Rfl: 3 .  ondansetron (ZOFRAN-ODT) 4 MG disintegrating tablet, Take 1 tablet (4 mg total) by mouth 2 (two) times daily as needed for nausea or vomiting., Disp: 20 tablet, Rfl: 3 .  phentermine 37.5 MG capsule, Take 1 capsule (37.5 mg total) by mouth every morning., Disp: 90 capsule, Rfl: 1 .  rizatriptan (MAXALT) 10 MG tablet, Take 1 tablet (10 mg total) by mouth as needed for migraine. May repeat in 2 hours if needed, Disp: 10 tablet, Rfl: 6 .  topiramate (TOPAMAX) 100 MG tablet, Take 1 tablet (100 mg total) by mouth 3 (three) times daily., Disp: 270 tablet, Rfl: 1 .  venlafaxine XR (EFFEXOR-XR) 150 MG 24 hr capsule, TAKE 1 CAPSULE(150 MG) BY MOUTH DAILY WITH BREAKFAST, Disp: 30 capsule, Rfl: 6 .  albuterol (PROVENTIL HFA;VENTOLIN HFA) 108 (90 BASE) MCG/ACT inhaler, Inhale into the lungs every 6 (six) hours as needed for wheezing or shortness of breath. Reported on 05/23/2015, Disp: , Rfl:    PAST MEDICAL HISTORY: Past Medical History  Diagnosis Date  . Headache   . Fibromyalgia     PAST SURGICAL HISTORY: History reviewed. No pertinent past surgical history.  FAMILY HISTORY: History reviewed. No pertinent family history. Son had one seizure  SOCIAL HISTORY:  Social History   Social History  . Marital Status: Married    Spouse Name: N/A  . Number of Children: N/A  . Years of Education: N/A   Occupational History  . Not on file.   Social History Main Topics  . Smoking status: Never Smoker   . Smokeless tobacco: Not on file  . Alcohol Use: 0.0 oz/week    0 Standard drinks or equivalent per week     Comment: social  . Drug Use: No  . Sexual Activity: Not on file   Other Topics Concern  . Not on file   Social History  Narrative     PHYSICAL EXAM  Filed Vitals:   05/23/15 1321  BP: 110/72  Pulse: 72  Resp: 14  Height: 5' (1.524 m)  Weight: 116 lb 3.2 oz (52.708 kg)    Body mass index is 22.69 kg/(m^2).   General: The patient is well-developed and well-nourished and in no acute distress  Neck/Musculoskeletal: The neck is supple,  The neck is mildly tender in the paraspinal muscles and trapezius muscles.  Also tender over  pectoralis muslces, prox arm and leg muscles and trochanteric bursae    Neurologic Exam  Mental status: The patient is alert and oriented x 3 at the time of the examination. The patient has  apparent normal recent and remote memory, with an apparently normal attention span and concentration ability.   Speech is normal.  Cranial nerves: Extraocular movements are full.  Facial symmetry is present. Facial strength is normal.  Trapezius and sternocleidomastoid strength is normal. No dysarthria is noted.  The tongue is midline, and the patient has symmetric elevation of the soft palate. No obvious hearing deficits are noted.  Motor:  Muscle bulk and tone are normal. Strength is  5 / 5 in all 4 extremities.   Sensory: Sensory testing is intact to touch on all 4 extremities.  Coordination: Cerebellar testing reveals good finger-nose-finger.  Gait and station: Station and gait are normal. Tandem gait is normal.   Reflexes: Deep tendon reflexes are symmetric and normally brisk bilaterally.     DIAGNOSTIC DATA (LABS, IMAGING, TESTING) - I reviewed patient records, labs, notes, testing and imaging myself where available.    ASSESSMENT AND PLAN   Migraine without aura and without status migrainosus, not intractable  Fibromyalgia  Attention deficit disorder  Convulsions, unspecified convulsion type (HCC)  Depression  Insomnia    1.   Continue Topiramate but decrease to 200 mg.   Insurance will not cover Trokendi  2.   Change Effexor  to Pristiq for mood and pain.    Gabapentin for FMS and sleep.     Try to get at least 6 hours sleep nightly 3.  Phentermine as a stimulant   I think the risk of seizures would be rather low as she is on Topamax and gabapentin  Return to clinic in 4 months or sooner if she has new or worsening neurologic symptoms.    Richard A. Epimenio Foot, MD, PhD 05/23/2015, 1:24 PM Certified in Neurology, Clinical Neurophysiology, Sleep Medicine, Pain Medicine and Neuroimaging  [facility] [address] [place], [place] [address] [phone]

## 2015-05-23 NOTE — Telephone Encounter (Signed)
[facility] is calling about Rx Desvenlafaxine ER 100 MG TB 24 and needs to know what brand to use, Preistiq or Ksedezla.  Please call.

## 2015-07-06 ENCOUNTER — Encounter: Payer: Self-pay | Admitting: *Deleted

## 2015-07-06 ENCOUNTER — Telehealth: Payer: Self-pay | Admitting: *Deleted

## 2015-07-06 NOTE — Telephone Encounter (Signed)
Methylphenidate 36mg  rx.'s printed 12-01-14 (3--1 current and 2 postdated) destroyed as they were never picked up/fim

## 2015-07-10 ENCOUNTER — Other Ambulatory Visit: Payer: Self-pay | Admitting: Neurology

## 2015-07-25 ENCOUNTER — Telehealth: Payer: Self-pay | Admitting: Neurology

## 2015-07-25 NOTE — Telephone Encounter (Signed)
Since she had the seizure while on Wellbutrin, it would be better for her not to get back on it. She would like to switch from Pristiq to Celexa, we can write for 40 mg daily.

## 2015-07-25 NOTE — Telephone Encounter (Signed)
LMTC./fim 

## 2015-07-25 NOTE — Telephone Encounter (Signed)
Maureen Moore [phone] SAME SATER [phone] * [phone] NEEDS TO CHANGE MED PRISTIQ, NOT DOING WELL ON IT, PLEASE CALL TO DISCUSS Y

## 2015-07-25 NOTE — Telephone Encounter (Signed)
I have spoken with Maureen Moore this afternoon--she sts. her mood has been "terrible" despite compliance with Pristiq.  Sts. there have been a couple of instances where she has had total inability to cope.  Feels she did better on combination of Celexa and Wellbutrin--would like to know if she can either go back to those meds, or increase Pristiq/fim

## 2015-07-26 MED ORDER — CITALOPRAM HYDROBROMIDE 40 MG PO TABS: 40.0000 mg | ORAL_TABLET | Freq: Every day | ORAL | Status: DC

## 2015-07-26 NOTE — Telephone Encounter (Signed)
I have spoken with Maureen Moore this morning and per RAS, advised that he would rather her not go back on Wellbutrin, as she has a sz. while on it, but that she could stop Pristiq and start Celexa 40mg  daily.  She verbalized understanding of same--is agreeable with this plan.  Rx. for Celexa escribed to [facility] per her request/fim

## 2015-08-27 ENCOUNTER — Other Ambulatory Visit: Payer: Self-pay | Admitting: Neurology

## 2015-09-20 ENCOUNTER — Encounter: Payer: Self-pay | Admitting: Neurology

## 2015-09-20 ENCOUNTER — Ambulatory Visit (INDEPENDENT_AMBULATORY_CARE_PROVIDER_SITE_OTHER): Payer: PRIVATE HEALTH INSURANCE | Admitting: Neurology

## 2015-09-20 VITALS — BP 122/80 | HR 70 | Resp 16 | Ht 60.0 in | Wt 114.0 lb

## 2015-09-20 DIAGNOSIS — M797 Fibromyalgia: Secondary | ICD-10-CM | POA: Diagnosis not present

## 2015-09-20 DIAGNOSIS — F32A Depression, unspecified: Secondary | ICD-10-CM

## 2015-09-20 DIAGNOSIS — G43009 Migraine without aura, not intractable, without status migrainosus: Secondary | ICD-10-CM | POA: Diagnosis not present

## 2015-09-20 DIAGNOSIS — F988 Other specified behavioral and emotional disorders with onset usually occurring in childhood and adolescence: Secondary | ICD-10-CM

## 2015-09-20 DIAGNOSIS — G4700 Insomnia, unspecified: Secondary | ICD-10-CM | POA: Diagnosis not present

## 2015-09-20 DIAGNOSIS — R569 Unspecified convulsions: Secondary | ICD-10-CM

## 2015-09-20 DIAGNOSIS — F909 Attention-deficit hyperactivity disorder, unspecified type: Secondary | ICD-10-CM

## 2015-09-20 DIAGNOSIS — F329 Major depressive disorder, single episode, unspecified: Secondary | ICD-10-CM | POA: Diagnosis not present

## 2015-09-20 MED ORDER — TOPIRAMATE 100 MG PO TABS: 100.0000 mg | ORAL_TABLET | Freq: Three times a day (TID) | ORAL | Status: DC

## 2015-09-20 MED ORDER — GABAPENTIN 300 MG PO CAPS: ORAL_CAPSULE | ORAL | Status: DC

## 2015-09-20 MED ORDER — PHENTERMINE HCL 37.5 MG PO CAPS: 37.5000 mg | ORAL_CAPSULE | ORAL | Status: DC

## 2015-09-20 MED ORDER — MILNACIPRAN HCL 25 MG PO TABS: ORAL_TABLET | ORAL | Status: DC

## 2015-09-20 MED ORDER — MILNACIPRAN HCL 50 MG PO TABS: 50.0000 mg | ORAL_TABLET | Freq: Two times a day (BID) | ORAL | Status: DC

## 2015-09-20 NOTE — Progress Notes (Signed)
[facility]  PATIENT: Maureen Moore DOB: 24-Sep-1970  REFERRING CLINICIAN: Brooke Bonito HISTORY FROM: patient REASON FOR VISIT: First seizure   HISTORICAL  CHIEF COMPLAINT:  Chief Complaint  Patient presents with  . Migraines    Sts. h/a's are less frequent, less severe.  She tried decreasing Topamax to  daily, but sts. h/a's worsened, so she has continued  daily.  She is only able to tolerate Gabapentin once daily, at hs, due to drowsiness.  Sts. she didn't do well on Pristiq, so she stopped it and started Effexor /fim    HISTORY OF PRESENT ILLNESS:  Maureen Moore is a 45 year old woman with migraine headaches,  ADD, insomnia, myalgias who had a single seizure 04/10/2014.      Migraine:   She is on topiramate 300 mg with only 4 or 5 days a month now.       When headache occurs, they are unilateral (either side) and she sometimes gets visual aura.   She gets associated photophobia and phonophobia. Moving worsens the headache. Typically, she will take a Zomig.   She has been on multiple prophylactic medications for the migraines in the past. Topiramate has helped the most but she is concerned it has caused a brain fog.     Keppra was not well tolerated.  Zonisamide caused a rash.    Seizure:   She has not had any more seizures.    She had the single seizure in January and has not had any further convulsions.  The seizure 04/10/2014 was in the setting of reducing topiramate dose and being on Wellbutrin and being partially sleep deprived.  CT and MRI 04/10/2014 were normal. An EEG last month was normal.  Fibromyalgia:   She has myalgias that are improved with Gabapentin.   The worse pain is in the trunk and shoulders.      ADD:   Her attention deficit and cognitive issues and focus did best  With dexmethylphenidate but it was not covered well with insurance.    Concerta was not tolerated as well.Ronne Binning also helped her and also helped her sleepiness but  was not covered.   Phentermine has helped sleepiness and also helps ADD.    Mood:   She feels depression is a little bit worse despite Celexa.  She has more stress lately and often worries about her kids (both are special needs).    Her mothe in law died since her last visit.    She feels she has no energy.    Insomnia:   Insomnia is doing well on current med's .  She does not snore.      Vit D:  She was found to have Vit D deficiency and is being supplemented  REVIEW OF SYSTEMS:  Constitutional: No fevers, chills, sweats, or change in appetite.   Sleepiness, insomnia   Eyes: No visual changes, double vision, eye pain Ear, nose and throat: No hearing loss, ear pain, nasal congestion, sore throat Cardiovascular: No chest pain, palpitations Respiratory:  Spome coughing and mild shortness of breath.   No wheezes GastrointestinaI: No nausea, vomiting, diarrhea, abdominal pain, fecal incontinence Genitourinary:  No dysuria, urinary retention or frequency.  She notes nocturia. Musculoskeletal:  Pain in neck, back and many large muscles and some joints Integumentary: No rash, pruritus, skin lesions.  Photosensitive to sulfa drugs.    Neurological: as above Psychiatric: Some depression.   Better on citalopram. Endocrine: No palpitations, diaphoresis, change in appetite,  some cold intolerance Hematologic/Lymphatic:  No anemia, purpura, petechiae. Allergic/Immunologic: She has seasonal allerigies.   No rashes  ALLERGIES: Allergies  Allergen Reactions  . Sulfa Antibiotics     HOME MEDICATIONS:  Current outpatient prescriptions:  .  albuterol (PROVENTIL HFA;VENTOLIN HFA) 108 (90 BASE) MCG/ACT inhaler, Inhale into the lungs every 6 (six) hours as needed for wheezing or shortness of breath. Reported on 05/23/2015, Disp: , Rfl:  .  citalopram (CELEXA) 40 MG tablet, Take 1 tablet (40 mg total) by mouth daily., Disp: 90 tablet, Rfl: 3 .  fexofenadine (ALLEGRA) 30 MG tablet, Take 30 mg by mouth 2  (two) times daily., Disp: , Rfl:  .  fluticasone (FLONASE) 50 MCG/ACT nasal spray, Place into both nostrils daily., Disp: , Rfl:  .  gabapentin (NEURONTIN) 300 MG capsule, One in am, one in evening and two at bedtime, Disp: 360 capsule, Rfl: 3 .  ondansetron (ZOFRAN-ODT) 4 MG disintegrating tablet, Take 1 tablet (4 mg total) by mouth 2 (two) times daily as needed for nausea or vomiting., Disp: 20 tablet, Rfl: 3 .  phentermine 37.5 MG capsule, Take 1 capsule (37.5 mg total) by mouth every morning., Disp: 90 capsule, Rfl: 1 .  rizatriptan (MAXALT) 10 MG tablet, Take 1 tablet (10 mg total) by mouth as needed for migraine. May repeat in 2 hours if needed, Disp: 10 tablet, Rfl: 6 .  rizatriptan (MAXALT) 10 MG tablet, TAKE 1 TABLET BY MOUTH AS NEEDED FOR MIGRAINE, Disp: 10 tablet, Rfl: 11 .  topiramate (TOPAMAX) 100 MG tablet, TAKE 1 TABLET BY MOUTH THREE TIMES DAILY, Disp: 270 tablet, Rfl: 1   PAST MEDICAL HISTORY: Past Medical History  Diagnosis Date  . Headache   . Fibromyalgia     PAST SURGICAL HISTORY: No past surgical history on file.  FAMILY HISTORY: No family history on file. Son had one seizure  SOCIAL HISTORY:  Social History   Social History  . Marital Status: Married    Spouse Name: N/A  . Number of Children: N/A  . Years of Education: N/A   Occupational History  . Not on file.   Social History Main Topics  . Smoking status: Never Smoker   . Smokeless tobacco: Not on file  . Alcohol Use: 0.0 oz/week    0 Standard drinks or equivalent per week     Comment: social  . Drug Use: No  . Sexual Activity: Not on file   Other Topics Concern  . Not on file   Social History Narrative     PHYSICAL EXAM  Filed Vitals:   09/20/15 1110  BP: 122/80  Pulse: 70  Resp: 16  Height: 5' (1.524 m)  Weight: 114 lb (51.71 kg)    Body mass index is 22.26 kg/(m^2).   General: The patient is well-developed and well-nourished and in no acute  distress  Neck/Musculoskeletal: The neck is supple,  The neck is non- tender itoday.   Slightly tender over  pectoralis muslces, prox arm and leg muscles and trochanteric bursae    Neurologic Exam  Mental status: The patient is alert and oriented x 3 at the time of the examination. The patient has apparent normal recent and remote memory, with an apparently normal attention span and concentration ability.   Speech is normal.  Cranial nerves: Extraocular movements are full.  Facial symmetry is present. Facial strength is normal.  Trapezius and sternocleidomastoid strength is normal. No dysarthria is noted.  The tongue is midline, and the patient has symmetric  elevation of the soft palate.    Motor:  Muscle bulk and tone are normal. Strength is  5 / 5 in all 4 extremities.   Sensory: Sensory testing is intact to touch on all 4 extremities.  Coordination: Cerebellar testing reveals good finger-nose-finger.  Gait and station: Station and gait are normal. Tandem gait is normal.   Reflexes: Deep tendon reflexes are symmetric and normally brisk bilaterally.     DIAGNOSTIC DATA (LABS, IMAGING, TESTING) - I reviewed patient records, labs, notes, testing and imaging myself where available.    ASSESSMENT AND PLAN   Convulsions, unspecified convulsion type (HCC)  Migraine without aura and without status migrainosus, not intractable  Fibromyalgia  Attention deficit disorder  Depression  Insomnia    1.   Continue Topiramate at 300 mg.    2.   Continue Celexa and add milnacipran for mood and myalgias.    Gabapentin for FMS and sleep.     Try to get at least 6 hours sleep nightly 3.  Phentermine as a stimulant and for ADD.    I think the risk of seizures would be rather low as she is on Topamax and gabapentin  Return to clinic in 6 months or sooner if she has new or worsening neurologic symptoms.    Richard A. Epimenio FootSater, MD, PhD 09/20/2015, 11:20 AM Certified in Neurology, Clinical  Neurophysiology, Sleep Medicine, Pain Medicine and Neuroimaging  [facility] [address], [address] [place], [place] [address] [phone] r

## 2015-09-20 NOTE — Patient Instructions (Signed)
Savella 25 mg daily 1 week, then 25 mg twice a day for 3 weeks.   That prescription is already a pharmacist.  The printed prescription is for Savella 50 mg twice a day that will be the maintenance dose.

## 2016-02-26 ENCOUNTER — Other Ambulatory Visit: Payer: Self-pay | Admitting: Neurology

## 2016-02-26 MED ORDER — ONDANSETRON 4 MG PO TBDP: 4.0000 mg | ORAL_TABLET | Freq: Two times a day (BID) | ORAL | 5 refills | Status: DC | PRN

## 2016-03-25 ENCOUNTER — Ambulatory Visit: Payer: Self-pay | Admitting: Neurology

## 2016-03-25 ENCOUNTER — Other Ambulatory Visit: Payer: Self-pay | Admitting: Neurology

## 2016-03-25 MED ORDER — PHENTERMINE HCL 37.5 MG PO CAPS: 37.5000 mg | ORAL_CAPSULE | ORAL | 0 refills | Status: DC

## 2016-03-25 MED ORDER — CITALOPRAM HYDROBROMIDE 40 MG PO TABS: 40.0000 mg | ORAL_TABLET | Freq: Every day | ORAL | 3 refills | Status: DC

## 2016-03-25 MED ORDER — MILNACIPRAN HCL 50 MG PO TABS: 50.0000 mg | ORAL_TABLET | Freq: Two times a day (BID) | ORAL | 3 refills | Status: DC

## 2016-03-25 MED ORDER — RIZATRIPTAN BENZOATE 10 MG PO TABS: 10.0000 mg | ORAL_TABLET | ORAL | 6 refills | Status: DC | PRN

## 2016-03-25 MED ORDER — TOPIRAMATE 100 MG PO TABS: 100.0000 mg | ORAL_TABLET | Freq: Three times a day (TID) | ORAL | 1 refills | Status: DC

## 2016-04-03 ENCOUNTER — Encounter: Payer: Self-pay | Admitting: Neurology

## 2016-05-30 ENCOUNTER — Ambulatory Visit (INDEPENDENT_AMBULATORY_CARE_PROVIDER_SITE_OTHER): Payer: BLUE CROSS/BLUE SHIELD | Admitting: Neurology

## 2016-05-30 ENCOUNTER — Encounter: Payer: Self-pay | Admitting: Neurology

## 2016-05-30 VITALS — BP 107/76 | HR 85 | Ht 60.0 in | Wt 115.5 lb

## 2016-05-30 DIAGNOSIS — R569 Unspecified convulsions: Secondary | ICD-10-CM

## 2016-05-30 DIAGNOSIS — F988 Other specified behavioral and emotional disorders with onset usually occurring in childhood and adolescence: Secondary | ICD-10-CM | POA: Diagnosis not present

## 2016-05-30 DIAGNOSIS — F329 Major depressive disorder, single episode, unspecified: Secondary | ICD-10-CM | POA: Diagnosis not present

## 2016-05-30 DIAGNOSIS — M797 Fibromyalgia: Secondary | ICD-10-CM | POA: Diagnosis not present

## 2016-05-30 DIAGNOSIS — G43109 Migraine with aura, not intractable, without status migrainosus: Secondary | ICD-10-CM | POA: Diagnosis not present

## 2016-05-30 DIAGNOSIS — F32A Depression, unspecified: Secondary | ICD-10-CM

## 2016-05-30 MED ORDER — FROVATRIPTAN SUCCINATE 2.5 MG PO TABS: ORAL_TABLET | ORAL | 11 refills | Status: DC

## 2016-05-30 MED ORDER — PHENTERMINE HCL 37.5 MG PO CAPS: 37.5000 mg | ORAL_CAPSULE | ORAL | 1 refills | Status: DC

## 2016-05-30 NOTE — Patient Instructions (Signed)
Your migraines tend to occur 4-7 days around her cycle.    To try to help the amount of time that you have migraine pain, take a rizatriptan at the onset of the migraine and then take a Frova triptan. Continue to take the frovatriptan 4 or 5 days in a row. Hopefully this will prevent the headache from recurring.

## 2016-05-30 NOTE — Progress Notes (Signed)
[facility]  PATIENT: Maureen Moore DOB: October 24, 1970  REFERRING CLINICIAN: Brooke BonitoWarren Gallemore HISTORY FROM: patient REASON FOR VISIT: First seizure   HISTORICAL  CHIEF COMPLAINT:  Chief Complaint  Patient presents with  . Follow-up    Migraine follow up,Convulsions,    HISTORY OF PRESENT ILLNESS:  Maureen Norma Fredricksonoledo is a 46 year old woman with migraine headaches,  ADD, insomnia, myalgias who had a single seizure 04/10/2014.      Migraine:   She is on topiramate 300 mg and is experiencing about with only 4 to 7 days a month now.    They always occur more around her cycle, usually in a pattern of daily times several days to a week..     When headache occurs, they are unilateral (either side) and she sometimes gets visual aura.   She gets associated photophobia and phonophobia. Moving worsens the headache. Maxalt will often help the headache but it returns later that day or the next day..   She has been on multiple prophylactic medications for the migraines in the past. Topiramate has helped the most but she is concerned it has caused a brain fog.     Keppra was not well tolerated.  Zonisamide caused a rash.    Seizure:   No recent seizure.    She had the single seizure in January 2016 and has not had any further convulsions.  The seizure 04/10/2014 was in the setting of tapering topiramate dose and being on Wellbutrin and being partially sleep deprived.  CT and MRI 04/10/2014 were normal. An EEGwas normal.  Fibromyalgia:  Her muscle aches are doing much better on gabapentin and milnacipran.   When present, he worse pain is in the trunk and shoulders.      ADD:   She notes that the phentermine is helping her ADD and also helps her daytime sleepiness.   Her attention deficit and cognitive issues and focus did best ith dexmethylphenidate but it was not covered well with insurance.    Concerta was not tolerated as well.   Nuvigil also helped her and also helped  her sleepiness but was not covered.       Mood:   Mood is stable. She is on Celexa..  She has stress and often worries about her kids (both are special needs).     Insomnia:   Insomnia is doing well on current med's .  She does not snore.      Vit D:  She was found to have Vit D deficiency and is being supplemented  REVIEW OF SYSTEMS:  Constitutional: No fevers, chills, sweats, or change in appetite.   Sleepiness, insomnia   Eyes: No visual changes, double vision, eye pain Ear, nose and throat: No hearing loss, ear pain, nasal congestion, sore throat Cardiovascular: No chest pain, palpitations Respiratory:  Spome coughing and mild shortness of breath.   No wheezes GastrointestinaI: No nausea, vomiting, diarrhea, abdominal pain, fecal incontinence Genitourinary:  No dysuria, urinary retention or frequency.  She notes nocturia. Musculoskeletal:  Pain in neck, back and many large muscles and some joints Integumentary: No rash, pruritus, skin lesions.  Photosensitive to sulfa drugs.    Neurological: as above Psychiatric: Some depression.   Better on citalopram. Endocrine: No palpitations, diaphoresis, change in appetite, some cold intolerance Hematologic/Lymphatic:  No anemia, purpura, petechiae. Allergic/Immunologic: She has seasonal allerigies.   No rashes  ALLERGIES: Allergies  Allergen Reactions  . Sulfa Antibiotics     HOME MEDICATIONS:  Current Outpatient Prescriptions:  .  albuterol (PROVENTIL HFA;VENTOLIN HFA) 108 (90 BASE) MCG/ACT inhaler, Inhale into the lungs every 6 (six) hours as needed for wheezing or shortness of breath. Reported on 05/23/2015, Disp: , Rfl:  .  aspirin EC 81 MG tablet, Take by mouth., Disp: , Rfl:  .  citalopram (CELEXA) 40 MG tablet, Take 1 tablet (40 mg total) by mouth daily., Disp: 90 tablet, Rfl: 3 .  fexofenadine (ALLEGRA) 30 MG tablet, Take 30 mg by mouth 3 times/day as needed-between meals & bedtime. , Disp: , Rfl:  .  fluticasone (FLONASE) 50  MCG/ACT nasal spray, Place into both nostrils daily., Disp: , Rfl:  .  gabapentin (NEURONTIN) 100 MG capsule, Take 100 mg by mouth., Disp: , Rfl:  .  Milnacipran (SAVELLA) 50 MG TABS tablet, Take 1 tablet (50 mg total) by mouth 2 (two) times daily., Disp: 180 tablet, Rfl: 3 .  ondansetron (ZOFRAN-ODT) 4 MG disintegrating tablet, Take 1 tablet (4 mg total) by mouth 2 (two) times daily as needed for nausea or vomiting., Disp: 20 tablet, Rfl: 5 .  phentermine 37.5 MG capsule, Take 1 capsule (37.5 mg total) by mouth every morning., Disp: 90 capsule, Rfl: 1 .  rizatriptan (MAXALT) 10 MG tablet, TAKE 1 TABLET BY MOUTH AS NEEDED FOR MIGRAINE, Disp: 10 tablet, Rfl: 11 .  topiramate (TOPAMAX) 100 MG tablet, Take 1 tablet (100 mg total) by mouth 3 (three) times daily., Disp: 270 tablet, Rfl: 1 .  frovatriptan (FROVA) 2.5 MG tablet, Take one po qd prn migraine, Disp: 9 tablet, Rfl: 11   PAST MEDICAL HISTORY: Past Medical History:  Diagnosis Date  . Fibromyalgia   . Headache     PAST SURGICAL HISTORY: History reviewed. No pertinent surgical history.  FAMILY HISTORY: Family History  Problem Relation Age of Onset  . Migraines Mother   . Migraines Father   . Migraines Sister    Son had one seizure  SOCIAL HISTORY:  Social History   Social History  . Marital status: Married    Spouse name: N/A  . Number of children: N/A  . Years of education: N/A   Occupational History  . Not on file.   Social History Main Topics  . Smoking status: Never Smoker  . Smokeless tobacco: Never Used  . Alcohol use 0.0 oz/week     Comment: social  . Drug use: No  . Sexual activity: Not on file   Other Topics Concern  . Not on file   Social History Narrative  . No narrative on file     PHYSICAL EXAM  Vitals:   05/30/16 1404  BP: 107/76  Pulse: 85  Weight: 115 lb 8 oz (52.4 kg)  Height: 5' (1.524 m)    Body mass index is 22.56 kg/m.   General: The patient is well-developed and  well-nourished and in no acute distress  Neck/Musculoskeletal:   The neck is non- tender.   She is mildly tender over  pectoralis muscles, prox arm and leg muscles     Neurologic Exam  Mental status: The patient is alert and oriented x 3 at the time of the examination. The patient has apparent normal recent and remote memory, with an apparently normal attention span and concentration ability.   Speech is normal.  Cranial nerves: Extraocular movements are full.    Facial strength is normal.  Trapezius and sternocleidomastoid strength is normal. No dysarthria is noted.  The tongue is midline, and the patient has symmetric elevation of the soft palate.  Motor:  Muscle bulk and tone are normal. Strength is  5 / 5 in all 4 extremities.   Sensory: Sensory testing is intact to touch on all 4 extremities.  Coordination: Cerebellar testing reveals good finger-nose-finger.  Gait and station: Station and gait are normal. Tandem gait is normal.   Reflexes: Deep tendon reflexes are symmetric and normally brisk bilaterally.     DIAGNOSTIC DATA (LABS, IMAGING, TESTING) - I reviewed patient records, labs, notes, testing and imaging myself where available.    ASSESSMENT AND PLAN   Migraine with aura and without status migrainosus, not intractable  Convulsions, unspecified convulsion type (HCC)  Attention deficit disorder (ADD) without hyperactivity  Depression, unspecified depression type  Fibromyalgia   1.   Continue Topiramate but reduce to 200 mg.   hopefully this is to help the brain fog. 2.   Continue Celexa and add milnacipran for mood and myalgias.    Gabapentin for FMS and sleep.     Try to get at least 6 hours sleep nightly 3.  Phentermine as a stimulant and for ADD and sleepiness.    I think the risk of seizures would be rather low as she is on Topamax and gabapentin 4.    She will try a strategy of taking daily frovatriptan for 4 or 5 days around her cycle when her headache  occurs. The first day, she can take a Maxalt and then taken frovatriptan but on other days she should just do the frovatriptan.  Return to clinic in 6 months or sooner if she has new or worsening neurologic symptoms.    Ica Daye A. Epimenio Foot, MD, PhD 05/30/2016, 2:33 PM Certified in Neurology, Clinical Neurophysiology, Sleep Medicine, Pain Medicine and Neuroimaging  [facility] [address], [address] [place], [place] [address] [phone] r

## 2016-07-18 ENCOUNTER — Other Ambulatory Visit: Payer: Self-pay | Admitting: Neurology

## 2016-07-24 ENCOUNTER — Other Ambulatory Visit: Payer: Self-pay | Admitting: Obstetrics and Gynecology

## 2016-07-24 DIAGNOSIS — Z1231 Encounter for screening mammogram for malignant neoplasm of breast: Secondary | ICD-10-CM

## 2016-12-05 ENCOUNTER — Encounter: Payer: Self-pay | Admitting: Neurology

## 2016-12-05 ENCOUNTER — Ambulatory Visit (INDEPENDENT_AMBULATORY_CARE_PROVIDER_SITE_OTHER): Payer: 59 | Admitting: Neurology

## 2016-12-05 VITALS — BP 120/85 | HR 85 | Resp 14 | Ht 60.0 in | Wt 119.0 lb

## 2016-12-05 DIAGNOSIS — M797 Fibromyalgia: Secondary | ICD-10-CM | POA: Diagnosis not present

## 2016-12-05 DIAGNOSIS — R569 Unspecified convulsions: Secondary | ICD-10-CM

## 2016-12-05 DIAGNOSIS — F988 Other specified behavioral and emotional disorders with onset usually occurring in childhood and adolescence: Secondary | ICD-10-CM

## 2016-12-05 DIAGNOSIS — G43109 Migraine with aura, not intractable, without status migrainosus: Secondary | ICD-10-CM

## 2016-12-05 MED ORDER — TOPIRAMATE ER 100 MG PO SPRINKLE CAP24: EXTENDED_RELEASE_CAPSULE | ORAL | 11 refills | Status: DC

## 2016-12-05 MED ORDER — ONDANSETRON 4 MG PO TBDP: 4.0000 mg | ORAL_TABLET | Freq: Two times a day (BID) | ORAL | 5 refills | Status: DC | PRN

## 2016-12-05 MED ORDER — TOPIRAMATE ER 100 MG PO SPRINKLE CAP24: EXTENDED_RELEASE_CAPSULE | ORAL | 3 refills | Status: DC

## 2016-12-05 MED ORDER — CITALOPRAM HYDROBROMIDE 40 MG PO TABS: 40.0000 mg | ORAL_TABLET | Freq: Every day | ORAL | 3 refills | Status: DC

## 2016-12-05 MED ORDER — PHENTERMINE HCL 37.5 MG PO CAPS: 37.5000 mg | ORAL_CAPSULE | ORAL | 1 refills | Status: DC

## 2016-12-05 NOTE — Progress Notes (Signed)
[facility]  PATIENT: Maureen Moore DOB: [birth year]  REFERRING CLINICIAN: Brooke Bonito HISTORY FROM: patient REASON FOR VISIT: First seizure   HISTORICAL  CHIEF COMPLAINT:  Chief Complaint  Patient presents with  . Seizures    Wants to decrease meds as much as possible.  Only taking Topamax  daily.  Has stopped Gabapentin. Denies sx. activity.  H/A's more frequent, some more severe, but not enough to increase Topamax again. Treating insomnia with essential oils, hypnosis./fim  . ADD  . Migraines  . Insomnia    HISTORY OF PRESENT ILLNESS:  Maureen Moore is a 46 year old woman with migraine headaches,  ADD, insomnia, myalgias who had a single seizure 04/10/2014.      Migraine:   She is on topiramate 100 mg and is having 7-8 migraine days a month, usually clustered around her cycle.    She is still having trouble tolerating this dose of topiramate and lower doses did not help.  300 mg helped more but brain fog was severe.  Marland Kitchen  HA's are daily (30/30 days and  > 4 hrs/day) if she does not take Topamax.   When headache occurs, they are unilateral (either side) and she sometimes gets visual aura.   She gets associated photophobia and phonophobia. Moving worsens the headache. Maxalt will often help the headache but it returns later that day or the next day.  The addition of Frova at the onset of her period has helped.   .   She has been on multiple prophylactic medications for the migraines in the past.  She feels the higher dose of TPM that helps the migraines is associated with more speech issues and brain fog.      Keppra was not well tolerated.  Zonisamide caused itching and she stopped.       Seizure:   No recent seizure.    She had the single seizure in January 2016 and has not had any further convulsions.  The seizure 04/10/2014 was in the setting of tapering topiramate dose and being on Wellbutrin and being partially sleep deprived.  CT and  MRI 04/10/2014 were normal. An EEG was normal.  Fibromyalgia:  Her muscle aches did better on gabapentin and milnacipran but she had a mental fog.   She stopped gabapentin and Savella and she feels more achy but tolerable.   When present, he worse pain is in the trunk and shoulders.     ADD:   She feels her attention deficit disorder is doing well on phentermine. Daytime sleepiness also improved. Dexmethylphenidate helped a little bit better than the phentermine but other stimulants were not well tolerated.     Nuvigil also helped her and also helped her sleepiness but was not covered.       Mood:   Mood is stable. She is on Celexa with benefit..  She has 2 special needs kids, one with moderately severe autism. And 1 with mild.   Insomnia:  She is sleeping fairly well with much less insomnia.    REVIEW OF SYSTEMS:  Constitutional: No fevers, chills, sweats, or change in appetite.   Sleepiness, insomnia   Eyes: No visual changes, double vision, eye pain Ear, nose and throat: No hearing loss, ear pain, nasal congestion, sore throat Cardiovascular: No chest pain, palpitations Respiratory:  Spome coughing and mild shortness of breath.   No wheezes GastrointestinaI: No nausea, vomiting, diarrhea, abdominal pain, fecal incontinence Genitourinary:  No dysuria, urinary retention or frequency.  She notes nocturia. Musculoskeletal:  Pain in neck, back and many large muscles and some joints Integumentary: No rash, pruritus, skin lesions.  Photosensitive to sulfa drugs.    Neurological: as above Psychiatric: Some depression.   Better on citalopram. Endocrine: No palpitations, diaphoresis, change in appetite, some cold intolerance Hematologic/Lymphatic:  No anemia, purpura, petechiae. Allergic/Immunologic: She has seasonal allerigies.   No rashes  ALLERGIES: Allergies  Allergen Reactions  . Sulfa Antibiotics     HOME MEDICATIONS:  Current Outpatient Prescriptions:  .  citalopram (CELEXA) 40 MG  tablet, Take 1 tablet (40 mg total) by mouth daily., Disp: 90 tablet, Rfl: 3 .  fluticasone (FLONASE) 50 MCG/ACT nasal spray, Place into both nostrils daily., Disp: , Rfl:  .  frovatriptan (FROVA) 2.5 MG tablet, Take one po qd prn migraine, Disp: 9 tablet, Rfl: 11 .  ondansetron (ZOFRAN-ODT) 4 MG disintegrating tablet, Take 1 tablet (4 mg total) by mouth 2 (two) times daily as needed for nausea or vomiting., Disp: 20 tablet, Rfl: 5 .  phentermine 37.5 MG capsule, Take 1 capsule (37.5 mg total) by mouth every morning., Disp: 90 capsule, Rfl: 1 .  rizatriptan (MAXALT) 10 MG tablet, TAKE 1 TABLET BY MOUTH AS NEEDED FOR MIGRAINE, Disp: 10 tablet, Rfl: 11 .  Topiramate ER (QUDEXY XR) 100 MG CS24 sprinkle capsule, One po qHS, Disp: 90 each, Rfl: 3   PAST MEDICAL HISTORY: Past Medical History:  Diagnosis Date  . Fibromyalgia   . Headache     PAST SURGICAL HISTORY: No past surgical history on file.  FAMILY HISTORY: Family History  Problem Relation Age of Onset  . Migraines Mother   . Migraines Father   . Migraines Sister    Son had one seizure  SOCIAL HISTORY:  Social History   Social History  . Marital status: Married    Spouse name: N/A  . Number of children: N/A  . Years of education: N/A   Occupational History  . Not on file.   Social History Main Topics  . Smoking status: Never Smoker  . Smokeless tobacco: Never Used  . Alcohol use 0.0 oz/week     Comment: social  . Drug use: No  . Sexual activity: Not on file   Other Topics Concern  . Not on file   Social History Narrative  . No narrative on file     PHYSICAL EXAM  Vitals:   12/05/16 1110  BP: 120/85  Pulse: 85  Resp: 14  Weight: 119 lb (54 kg)  Height: 5' (1.524 m)    Body mass index is 23.24 kg/m.   General: The patient is well-developed and well-nourished and in no acute distress  Neck/Musculoskeletal:   The neck is nontender and has good range of motion. She has mild tenderness over the  classic fibromyalgia tender points.    Neurologic Exam  Mental status: The patient is alert and oriented x 3 at the time of the examination. The patient has apparent normal recent and remote memory, with an apparently normal attention span and concentration ability.   Speech is normal.  Cranial nerves: Extraocular movements are full.    Facial strength is normal.  Trapezius and sternocleidomastoid strength is normal. No dysarthria is noted.  The tongue is midline, and the patient has symmetric elevation of the soft palate.    Motor:  Muscle bulk and tone are normal. Strength is  5 / 5 in all 4 extremities.   Sensory: Sensory testing is intact to touch on all 4 extremities.  Coordination:  Cerebellar testing reveals good finger-nose-finger.  Gait and station: Station and gait are normal. Tandem gait is normal.   Reflexes: Deep tendon reflexes are symmetric and normally brisk bilaterally.     DIAGNOSTIC DATA (LABS, IMAGING, TESTING) - I reviewed patient records, labs, notes, testing and imaging myself where available.    ASSESSMENT AND PLAN   Migraine with aura and without status migrainosus, not intractable  Convulsions, unspecified convulsion type (HCC)  Attention deficit disorder (ADD) without hyperactivity  Fibromyalgia   1.   Due to continued cognitive issues with topiramate, I would like her to start an extended release topiramate (either Quedexy or Trokendi). 2.   Continue Celexa for mood and myalgias.   Can remain off milnacipran and gabapentin.  3.   Continue Phentermine as a stimulant and for ADD and sleepiness.   4.   Continue taking daily frovatriptan for 4 or 5 days around her cycle when her headache occurs. The first day, she can take a Maxalt and then taken frovatriptan but on other days she should just do the frovatriptan.  We discussed the new anti-CGRP compounds and I would consider one of these in the future.  Return to clinic in 6 months or sooner if she  has new or worsening neurologic symptoms.    Maddax Palinkas A. Epimenio Foot, MD, PhD 12/05/2016, 11:51 AM Certified in Neurology, Clinical Neurophysiology, Sleep Medicine, Pain Medicine and Neuroimaging  [facility] [address], [address] [place], [place] [address] [phone] r

## 2016-12-17 ENCOUNTER — Ambulatory Visit: Payer: PRIVATE HEALTH INSURANCE

## 2016-12-20 ENCOUNTER — Other Ambulatory Visit: Payer: Self-pay | Admitting: Neurology

## 2017-02-05 ENCOUNTER — Ambulatory Visit: Payer: PRIVATE HEALTH INSURANCE

## 2017-03-24 ENCOUNTER — Other Ambulatory Visit: Payer: Self-pay | Admitting: Neurology

## 2017-03-24 MED ORDER — RIZATRIPTAN BENZOATE 10 MG PO TABS: 10.0000 mg | ORAL_TABLET | ORAL | 4 refills | Status: DC | PRN

## 2017-04-01 ENCOUNTER — Ambulatory Visit: Payer: PRIVATE HEALTH INSURANCE

## 2017-04-23 ENCOUNTER — Ambulatory Visit
Admission: RE | Admit: 2017-04-23 | Discharge: 2017-04-23 | Disposition: A | Discharge status: Home / Self Care | Payer: PRIVATE HEALTH INSURANCE | Source: Ambulatory Visit | Attending: Obstetrics and Gynecology | Admitting: Obstetrics and Gynecology

## 2017-04-23 DIAGNOSIS — Z1231 Encounter for screening mammogram for malignant neoplasm of breast: Secondary | ICD-10-CM

## 2017-06-05 ENCOUNTER — Ambulatory Visit: Payer: Self-pay | Admitting: Neurology

## 2017-06-07 ENCOUNTER — Other Ambulatory Visit: Payer: Self-pay | Admitting: Neurology

## 2017-06-12 ENCOUNTER — Ambulatory Visit (INDEPENDENT_AMBULATORY_CARE_PROVIDER_SITE_OTHER): Payer: 59 | Admitting: Neurology

## 2017-06-12 ENCOUNTER — Encounter: Payer: Self-pay | Admitting: Neurology

## 2017-06-12 VITALS — BP 121/78 | HR 81 | Resp 16 | Ht 60.0 in | Wt 127.5 lb

## 2017-06-12 DIAGNOSIS — M797 Fibromyalgia: Secondary | ICD-10-CM

## 2017-06-12 DIAGNOSIS — F988 Other specified behavioral and emotional disorders with onset usually occurring in childhood and adolescence: Secondary | ICD-10-CM | POA: Diagnosis not present

## 2017-06-12 DIAGNOSIS — G4700 Insomnia, unspecified: Secondary | ICD-10-CM

## 2017-06-12 DIAGNOSIS — F329 Major depressive disorder, single episode, unspecified: Secondary | ICD-10-CM | POA: Diagnosis not present

## 2017-06-12 DIAGNOSIS — G43109 Migraine with aura, not intractable, without status migrainosus: Secondary | ICD-10-CM | POA: Diagnosis not present

## 2017-06-12 DIAGNOSIS — R569 Unspecified convulsions: Secondary | ICD-10-CM | POA: Diagnosis not present

## 2017-06-12 DIAGNOSIS — F32A Depression, unspecified: Secondary | ICD-10-CM

## 2017-06-12 MED ORDER — TOPIRAMATE 50 MG PO TABS: ORAL_TABLET | ORAL | 4 refills | Status: DC

## 2017-06-12 MED ORDER — AMPHETAMINE-DEXTROAMPHET ER 30 MG PO CP24: 30.0000 mg | ORAL_CAPSULE | Freq: Every day | ORAL | 0 refills | Status: DC

## 2017-06-12 NOTE — Progress Notes (Signed)
[facility]  PATIENT: Maureen Moore DOB: [birth year]  REFERRING CLINICIAN: Brooke Bonito   HISTORICAL  CHIEF COMPLAINT:  Chief Complaint  Patient presents with  . Migraines    Believes migraines are less frequent, less severe.  Stopped Frova and Tomamax ER due to cost, taking IR Topamax. More anxiety/depression related to lifestyle/fim    HISTORY OF PRESENT ILLNESS:  Maureen Moore is a 47 year old woman with migraine headaches,  ADD, insomnia, myalgias who had a single seizure 04/10/2014.      Update 06/12/2017:      She os generally doing well.   She is back on the 100 mg regular topiramate as extended release was very expensive.  She has sometimes had trouble tolerating Topamax, however.  She is now having less than 3 HA's a month.    She uses Maxalt and/or Frova if one occurs.    She does have a headache, they would usually be acute unilateral and associated with photophobia and phonophobia and sometimes nausea.  Moving will worsen the headaches.  Fibromyalgia pain does well on gabapentin.  She tolerates it well.  She is on phentermine for sleepiness and ADD.   Nuvigil also had helped the sleepiness.     She feels that the phentermine helps some but not as well as some stimulants had in the past.  The phentermine seem to help her more first.      She has not had any more seizures.    Her seizure occurred on Wellbutrin.   She felt better on Wellbutrin but due to the seizure, I so not think she should go on it.   From 12/05/2016: Migraine:   She is on topiramate 100 mg and is having 7-8 migraine days a month, usually clustered around her cycle.    She is still having trouble tolerating this dose of topiramate and lower doses did not help.  300 mg helped more but brain fog was severe.  Marland Kitchen  HA's are daily (30/30 days and  > 4 hrs/day) if she does not take Topamax.   When headache occurs, they are unilateral (either side) and she sometimes gets visual  aura.   She gets associated photophobia and phonophobia. Moving worsens the headache. Maxalt will often help the headache but it returns later that day or the next day.  The addition of Frova at the onset of her period has helped.   .   She has been on multiple prophylactic medications for the migraines in the past.  She feels the higher dose of TPM that helps the migraines is associated with more speech issues and brain fog.      Keppra was not well tolerated.  Zonisamide caused itching and she stopped.       Seizure:   No recent seizure.    She had the single seizure in January 2016 and has not had any further convulsions.  The seizure 04/10/2014 was in the setting of tapering topiramate dose and being on Wellbutrin and being partially sleep deprived.  CT and MRI 04/10/2014 were normal. An EEG was normal.  Fibromyalgia:  Her muscle aches did better on gabapentin and milnacipran but she had a mental fog.   She stopped gabapentin and Savella and she feels more achy but tolerable.   When present, he worse pain is in the trunk and shoulders.     ADD:   She feels her attention deficit disorder is doing well on phentermine. Daytime sleepiness also improved. Dexmethylphenidate helped a  little bit better than the phentermine but other stimulants were not well tolerated.     Nuvigil also helped her and also helped her sleepiness but was not covered.       Mood:   Mood is stable. She is on Celexa with benefit..  She has 2 special needs kids, one with moderately severe autism. And 1 with mild.   Insomnia:  She is sleeping fairly well with much less insomnia.    REVIEW OF SYSTEMS:  Constitutional: No fevers, chills, sweats, or change in appetite.   Sleepiness, insomnia   Eyes: No visual changes, double vision, eye pain Ear, nose and throat: No hearing loss, ear pain, nasal congestion, sore throat Cardiovascular: No chest pain, palpitations Respiratory:  Spome coughing and mild shortness of breath.   No  wheezes GastrointestinaI: No nausea, vomiting, diarrhea, abdominal pain, fecal incontinence Genitourinary:  No dysuria, urinary retention or frequency.  She notes nocturia. Musculoskeletal:  Pain in neck, back and many large muscles and some joints Integumentary: No rash, pruritus, skin lesions.  Photosensitive to sulfa drugs.    Neurological: as above Psychiatric: Some depression.   Better on citalopram. Endocrine: No palpitations, diaphoresis, change in appetite, some cold intolerance Hematologic/Lymphatic:  No anemia, purpura, petechiae. Allergic/Immunologic: She has seasonal allerigies.   No rashes  ALLERGIES: Allergies  Allergen Reactions  . Sulfa Antibiotics     HOME MEDICATIONS:  Current Outpatient Medications:  .  citalopram (CELEXA) 40 MG tablet, Take 1 tablet (40 mg total) by mouth daily., Disp: 90 tablet, Rfl: 3 .  fluticasone (FLONASE) 50 MCG/ACT nasal spray, Place into both nostrils daily., Disp: , Rfl:  .  ondansetron (ZOFRAN-ODT) 4 MG disintegrating tablet, Take 1 tablet (4 mg total) by mouth 2 (two) times daily as needed for nausea or vomiting., Disp: 20 tablet, Rfl: 5 .  phentermine 37.5 MG capsule, Take 1 capsule (37.5 mg total) by mouth every morning., Disp: 90 capsule, Rfl: 1 .  rizatriptan (MAXALT) 10 MG tablet, Take 1 tablet (10 mg total) by mouth as needed for migraine. May repeat in 2 hours if needed, Disp: 30 tablet, Rfl: 4 .  amphetamine-dextroamphetamine (ADDERALL XR) 30 MG 24 hr capsule, Take 1 capsule (30 mg total) by mouth daily., Disp: 30 capsule, Rfl: 0 .  frovatriptan (FROVA) 2.5 MG tablet, Take one po qd prn migraine, Disp: 9 tablet, Rfl: 11 .  topiramate (TOPAMAX) 50 MG tablet, One po bid, Disp: 180 tablet, Rfl: 4   PAST MEDICAL HISTORY: Past Medical History:  Diagnosis Date  . Fibromyalgia   . Headache     PAST SURGICAL HISTORY: History reviewed. No pertinent surgical history.  FAMILY HISTORY: Family History  Problem Relation Age of Onset   . Migraines Mother   . Migraines Father   . Migraines Sister    Son had one seizure  SOCIAL HISTORY:  Social History   Socioeconomic History  . Marital status: Married    Spouse name: Not on file  . Number of children: Not on file  . Years of education: Not on file  . Highest education level: Not on file  Occupational History  . Not on file  Social Needs  . Financial resource strain: Not on file  . Food insecurity:    Worry: Not on file    Inability: Not on file  . Transportation needs:    Medical: Not on file    Non-medical: Not on file  Tobacco Use  . Smoking status: Never Smoker  . Smokeless  tobacco: Never Used  Substance and Sexual Activity  . Alcohol use: Yes    Alcohol/week: 0.0 oz    Comment: social  . Drug use: No  . Sexual activity: Not on file  Lifestyle  . Physical activity:    Days per week: Not on file    Minutes per session: Not on file  . Stress: Not on file  Relationships  . Social connections:    Talks on phone: Not on file    Gets together: Not on file    Attends religious service: Not on file    Active member of club or organization: Not on file    Attends meetings of clubs or organizations: Not on file    Relationship status: Not on file  . Intimate partner violence:    Fear of current or ex partner: Not on file    Emotionally abused: Not on file    Physically abused: Not on file    Forced sexual activity: Not on file  Other Topics Concern  . Not on file  Social History Narrative  . Not on file     PHYSICAL EXAM  Vitals:   06/12/17 1134  BP: 121/78  Pulse: 81  Resp: 16  Weight: 127 lb 8 oz (57.8 kg)  Height: 5' (1.524 m)    Body mass index is 24.9 kg/m.   General: The patient is well-developed and well-nourished and in no acute distress  Neck/Musculoskeletal:   There is mild tenderness in the neck and the classic fibromyalgia tender points.  The neck has a good range of motion.  Neurologic Exam  Mental status: The  patient is alert and oriented x 3 at the time of the examination. The patient has apparent normal recent and remote memory, with an apparently normal attention span and concentration ability.   Speech is normal.  Cranial nerves: Extraocular movements are full.   She has normal facial strength and sensation.  Trapezius strength is normal.  The tongue is midline, and the patient has symmetric elevation of the soft palate.    Motor:  Muscle bulk and tone are normal. Strength is  5 / 5 in all 4 extremities.   Sensory: She has intact sensation to touch and vibration.  Coordination: Cerebellar testing reveals good finger-nose-finger.  Gait and station: Station and gait are normal. Tandem gait is normal.   Reflexes: Deep tendon reflexes are symmetric and normally brisk bilaterally.     DIAGNOSTIC DATA (LABS, IMAGING, TESTING) - I reviewed patient records, labs, notes, testing and imaging myself where available.    ASSESSMENT AND PLAN   Convulsions, unspecified convulsion type (HCC)  Migraine with aura and without status migrainosus, not intractable  Attention deficit disorder (ADD) without hyperactivity  Depression, unspecified depression type  Insomnia, unspecified type  Fibromyalgia   1.   For the headaches continue topiramate 100 mg nightly.  I will prescribed the dose is 50 mg twice a day so she can try to taper the dose if she gets more cognitive issues.  If headaches worsen, consider Ajovy or 1 of the other similar medications 2.   Continue Celexa for mood and myalgias.   She will also continue to take gabapentin. 3.   Stop the phentermine.  And Adderall XR 30 mg daily for sleepiness and fatigue.   4.    Return to clinic in 6 months or sooner if she has new or worsening neurologic symptoms.    Granite Godman A. Epimenio Foot, MD, PhD 06/12/2017, 1:23 PM  Certified in Neurology, Clinical Neurophysiology, Sleep Medicine, Pain Medicine and Neuroimaging  [facility] [address] [place], [place] [address] [phone] r

## 2017-08-14 ENCOUNTER — Other Ambulatory Visit: Payer: Self-pay | Admitting: Neurology

## 2017-08-14 MED ORDER — AMPHETAMINE-DEXTROAMPHET ER 30 MG PO CP24: 30.0000 mg | ORAL_CAPSULE | Freq: Every day | ORAL | 0 refills | Status: DC

## 2017-08-14 NOTE — Telephone Encounter (Signed)
Pt request refill for amphetamine-dextroamphetamine (ADDERALL XR) 30 MG 24 hr capsule [facility]

## 2017-12-18 ENCOUNTER — Ambulatory Visit (INDEPENDENT_AMBULATORY_CARE_PROVIDER_SITE_OTHER): Payer: 59 | Admitting: Neurology

## 2017-12-18 ENCOUNTER — Encounter: Payer: Self-pay | Admitting: Neurology

## 2017-12-18 VITALS — BP 110/72 | HR 68 | Resp 16 | Ht 60.0 in | Wt 132.0 lb

## 2017-12-18 DIAGNOSIS — M797 Fibromyalgia: Secondary | ICD-10-CM | POA: Diagnosis not present

## 2017-12-18 DIAGNOSIS — R569 Unspecified convulsions: Secondary | ICD-10-CM | POA: Diagnosis not present

## 2017-12-18 DIAGNOSIS — G43109 Migraine with aura, not intractable, without status migrainosus: Secondary | ICD-10-CM

## 2017-12-18 DIAGNOSIS — F988 Other specified behavioral and emotional disorders with onset usually occurring in childhood and adolescence: Secondary | ICD-10-CM | POA: Diagnosis not present

## 2017-12-18 MED ORDER — ZONISAMIDE 100 MG PO CAPS: ORAL_CAPSULE | ORAL | 1 refills | Status: DC

## 2017-12-18 NOTE — Progress Notes (Signed)
[facility]  PATIENT: Maureen Moore DOB: Jun 01, 1970  REFERRING CLINICIAN: Brooke Bonito   HISTORICAL  CHIEF COMPLAINT:  Chief Complaint  Patient presents with  . Migraines    Migraines became more frequent, more severe, so she restarted Topamax and is currently taking  200mg  qam, 100mg  qpm.  PCP switched  Adderall XR 30mg to Methylphenidate 36mg , and switched Celexa 40mg  to Escitalopram 20mg /fim  . Single Seizure    HISTORY OF PRESENT ILLNESS:  Maureen Moore is a 47 y.o. woman with migraine headaches,  ADD, insomnia, myalgias who had a single seizure 04/10/2014.      Update 12/28/2017: Migraines are more frequent.    She tried to get off of topiramate but migraines were worse so she went back on it.  She was only have 2 migraines a month on high dose topiramate but had trouble tolerating it.   Trokendi was too expensive.     She has not had any seizures since 04/10/2014 when she had a single seizure while on Wellbutrin.        Mood has been worse and she was switched form citalopram to escitalopram.   She is very tired.       Update 06/12/2017:      She os generally doing well.   She is back on the 100 mg regular topiramate as extended release was very expensive.  She has sometimes had trouble tolerating Topamax, however.  She is now having less than 3 HA's a month.    She uses Maxalt and/or Frova if one occurs.    She does have a headache, they would usually be acute unilateral and associated with photophobia and phonophobia and sometimes nausea.  Moving will worsen the headaches.  Fibromyalgia pain does well on gabapentin.  She tolerates it well.  She is on phentermine for sleepiness and ADD.   Nuvigil also had helped the sleepiness.     She feels that the phentermine helps some but not as well as some stimulants had in the past.  The phentermine seem to help her more first.      She has not had any more seizures.    Her seizure occurred on  Wellbutrin.   She felt better on Wellbutrin but due to the seizure, I so not think she should go on it.   From 12/05/2016: Migraine:   She is on topiramate 100 mg and is having 7-8 migraine days a month, usually clustered around her cycle.    She is still having trouble tolerating this dose of topiramate and lower doses did not help.  300 mg helped more but brain fog was severe.  Marland Kitchen  HA's are daily (30/30 days and  > 4 hrs/day) if she does not take Topamax.   When headache occurs, they are unilateral (either side) and she sometimes gets visual aura.   She gets associated photophobia and phonophobia. Moving worsens the headache. Maxalt will often help the headache but it returns later that day or the next day.  The addition of Frova at the onset of her period has helped.   .   She has been on multiple prophylactic medications for the migraines in the past.  She feels the higher dose of TPM that helps the migraines is associated with more speech issues and brain fog.      Keppra was not well tolerated.  Zonisamide caused itching and she stopped.       Seizure:   No recent seizure.  She had the single seizure in January 2016 and has not had any further convulsions.  The seizure 04/10/2014 was in the setting of tapering topiramate dose and being on Wellbutrin and being partially sleep deprived.  CT and MRI 04/10/2014 were normal. An EEG was normal.  Fibromyalgia:  Her muscle aches did better on gabapentin and milnacipran but she had a mental fog.   She stopped gabapentin and Savella and she feels more achy but tolerable.   When present, he worse pain is in the trunk and shoulders.     ADD:   She feels her attention deficit disorder is doing well on phentermine. Daytime sleepiness also improved. Dexmethylphenidate helped a little bit better than the phentermine but other stimulants were not well tolerated.     Nuvigil also helped her and also helped her sleepiness but was not covered.       Mood:   Mood is  stable. She is on Celexa with benefit..  She has 2 special needs kids, one with moderately severe autism. And 1 with mild.   Insomnia:  She is sleeping fairly well with much less insomnia.    REVIEW OF SYSTEMS:  Constitutional: No fevers, chills, sweats, or change in appetite.   She has some insomnia.  Eyes: No visual changes, double vision, eye pain Ear, nose and throat: No hearing loss, ear pain, nasal congestion, sore throat Cardiovascular: No chest pain, palpitations Respiratory:  Spome coughing and mild shortness of breath.   No wheezes GastrointestinaI: No nausea, vomiting, diarrhea, abdominal pain, fecal incontinence Genitourinary:  No dysuria, urinary retention or frequency.  She notes nocturia. Musculoskeletal:  Pain in neck, back and many large muscles and some joints Integumentary: No rash, pruritus, skin lesions.  She had some photosensitivity to sulfa drugs.    Neurological: as above Psychiatric: Some depression.   Better on citalopram. Endocrine: No palpitations, diaphoresis, change in appetite, some cold intolerance Hematologic/Lymphatic:  No anemia, purpura, petechiae. Allergic/Immunologic: She has seasonal allerigies.     ALLERGIES: Allergies  Allergen Reactions  . Sulfa Antibiotics     HOME MEDICATIONS:  Current Outpatient Medications:  .  escitalopram (LEXAPRO) 20 MG tablet, Take 20 mg by mouth daily., Disp: , Rfl: 2 .  fluticasone (FLONASE) 50 MCG/ACT nasal spray, Place into both nostrils daily., Disp: , Rfl:  .  methylphenidate 36 MG PO CR tablet, TAKE 1 TABLET BY MOUTH ONCE DAILY IN THE MORNING, Disp: , Rfl: 0 .  ondansetron (ZOFRAN-ODT) 4 MG disintegrating tablet, Take 1 tablet (4 mg total) by mouth 2 (two) times daily as needed for nausea or vomiting., Disp: 20 tablet, Rfl: 5 .  rizatriptan (MAXALT) 10 MG tablet, Take 1 tablet (10 mg total) by mouth as needed for migraine. May repeat in 2 hours if needed, Disp: 30 tablet, Rfl: 4 .  topiramate (TOPAMAX) 50 MG  tablet, One po bid, Disp: 180 tablet, Rfl: 4 .  amphetamine-dextroamphetamine (ADDERALL XR) 30 MG 24 hr capsule, Take 1 capsule (30 mg total) by mouth daily. (Patient not taking: Reported on 12/18/2017), Disp: 30 capsule, Rfl: 0 .  citalopram (CELEXA) 40 MG tablet, Take 1 tablet (40 mg total) by mouth daily. (Patient not taking: Reported on 12/18/2017), Disp: 90 tablet, Rfl: 3 .  frovatriptan (FROVA) 2.5 MG tablet, Take one po qd prn migraine (Patient not taking: Reported on 12/18/2017), Disp: 9 tablet, Rfl: 11 .  zonisamide (ZONEGRAN) 100 MG capsule, Three pills po qHS, Disp: 90 capsule, Rfl: 1   PAST MEDICAL HISTORY:  Past Medical History:  Diagnosis Date  . Fibromyalgia   . Headache     PAST SURGICAL HISTORY: History reviewed. No pertinent surgical history.  FAMILY HISTORY: Family History  Problem Relation Age of Onset  . Migraines Mother   . Migraines Father   . Migraines Sister    Son had one seizure  SOCIAL HISTORY:  Social History   Socioeconomic History  . Marital status: Married    Spouse name: Not on file  . Number of children: Not on file  . Years of education: Not on file  . Highest education level: Not on file  Occupational History  . Not on file  Social Needs  . Financial resource strain: Not on file  . Food insecurity:    Worry: Not on file    Inability: Not on file  . Transportation needs:    Medical: Not on file    Non-medical: Not on file  Tobacco Use  . Smoking status: Never Smoker  . Smokeless tobacco: Never Used  Substance and Sexual Activity  . Alcohol use: Yes    Alcohol/week: 0.0 standard drinks    Comment: social  . Drug use: No  . Sexual activity: Not on file  Lifestyle  . Physical activity:    Days per week: Not on file    Minutes per session: Not on file  . Stress: Not on file  Relationships  . Social connections:    Talks on phone: Not on file    Gets together: Not on file    Attends religious service: Not on file    Active  member of club or organization: Not on file    Attends meetings of clubs or organizations: Not on file    Relationship status: Not on file  . Intimate partner violence:    Fear of current or ex partner: Not on file    Emotionally abused: Not on file    Physically abused: Not on file    Forced sexual activity: Not on file  Other Topics Concern  . Not on file  Social History Narrative  . Not on file     PHYSICAL EXAM  Vitals:   12/18/17 1124  BP: 110/72  Pulse: 68  Resp: 16  Weight: 132 lb (59.9 kg)  Height: 5' (1.524 m)    Body mass index is 25.78 kg/m.   General: The patient is well-developed and well-nourished and in no acute distress  Neck/Musculoskeletal: She has mild tenderness in the neck and over some of the classic fibromyalgia tender points.  The neck has a good range of motion.  Neurologic Exam  Mental status: The patient is alert and oriented x 3 at the time of the examination. The patient has apparent normal recent and remote memory, with an apparently normal attention span and concentration ability.   Speech is normal.  Cranial nerves: Extraocular movements are full.  Facial strength and sensation was normal.  Trapezius strength was normal.  The tongue is midline, and the patient has symmetric elevation of the soft palate.    Motor:  Muscle bulk and tone are normal. Strength is  5 / 5 in all 4 extremities.   Sensory: She has intact sensation to touch and vibration.  Coordination: Cerebellar testing reveals good finger-nose-finger.  Gait and station: Station and gait are normal. Tandem gait is normal.   Reflexes: Deep tendon reflexes are symmetric and normally brisk bilaterally.     DIAGNOSTIC DATA (LABS, IMAGING, TESTING) - I reviewed patient  records, labs, notes, testing and imaging myself where available.    ASSESSMENT AND PLAN   Migraine with aura and without status migrainosus, not intractable  Convulsions, unspecified convulsion type  (HCC)  Attention deficit disorder (ADD) without hyperactivity  Fibromyalgia   1.   She gets cognitive issues on higher doses of Topamax, though it has been beneficial for her.  We discussed zonisamide as some people tolerate this better than Topamax.   There is about a 10% cross-reactivity between allergy to sulfa drugs and non-antibiotic sulfonamides.  She is advised to stop zonisamide if she develops a rash or other symptoms.   2.   Continue Lexapro for mood and myalgias.   She will also continue to take gabapentin.  She also continue the stimulant. 3.    Return to clinic in 6 months or sooner if she has new or worsening neurologic symptoms.    Richard A. Epimenio Foot, MD, PhD 12/18/2017, 2:01 PM Certified in Neurology, Clinical Neurophysiology, Sleep Medicine, Pain Medicine and Neuroimaging  [facility] [address] [place], [place] [address] [phone] r

## 2018-02-18 ENCOUNTER — Telehealth: Payer: Self-pay | Admitting: Neurology

## 2018-02-18 MED ORDER — TOPIRAMATE 100 MG PO TABS: ORAL_TABLET | ORAL | 1 refills | Status: DC

## 2018-02-18 NOTE — Telephone Encounter (Signed)
Patient states zonisamide (ZONEGRAN) 100 MG capsule did not help with migraines and she would like to go back to taking topiramate (TOPAMAX) 50 MG tablet. Please call and discuss. [facility] on [address] [place].

## 2018-02-18 NOTE — Addendum Note (Signed)
Addended by: Candis SchatzMISENHEIMER, Tredarius Cobern I on: 02/18/2018 10:45 AM   Modules accepted: Orders

## 2018-02-18 NOTE — Telephone Encounter (Signed)
I spoke with Maureen Moore. She reports more migraines on Zonegran than Topamax, so she d/c Zonegran and restarted Topamax 50mg  tablets that she had left over from old rx's, and has titrated back up to 300mg  qhs and feels she is doing well on this dose. Per RAS, ok to continue Topamax 300mg  qhs. [facility]'s request/fim

## 2018-06-11 ENCOUNTER — Other Ambulatory Visit: Payer: Self-pay | Admitting: Neurology

## 2018-06-11 MED ORDER — RIZATRIPTAN BENZOATE 10 MG PO TABS: 10.0000 mg | ORAL_TABLET | ORAL | 4 refills | Status: AC | PRN

## 2018-06-11 MED ORDER — ONDANSETRON 4 MG PO TBDP: 4.0000 mg | ORAL_TABLET | Freq: Two times a day (BID) | ORAL | 5 refills | Status: AC | PRN

## 2018-06-11 NOTE — Telephone Encounter (Signed)
Pt request refill for rizatriptan (MAXALT) 10 MG tablet 90 day supply and ondansetron (ZOFRAN-ODT) 4 MG disintegrating tablet 90 day supply sent to [facility] (new pharmacy)

## 2018-06-11 NOTE — Telephone Encounter (Signed)
Pt has appt 06-25-18.  Renewed both maxalt and zofran.

## 2018-06-23 ENCOUNTER — Telehealth: Payer: Self-pay | Admitting: *Deleted

## 2018-06-23 NOTE — Telephone Encounter (Signed)
I called pt. Updated medication list, allergy and pharmacy for virtual visit on 06/25/18 with Dr. Epimenio Foot.   She is following with psychiatrist again. She recommended Aplenzin 174mg  but pt is hesitant to take. Having more problems with depression and medications are being adjusted right. Fibromyalgia sx are worse right now as well.

## 2018-06-25 ENCOUNTER — Ambulatory Visit (INDEPENDENT_AMBULATORY_CARE_PROVIDER_SITE_OTHER): Payer: 59 | Admitting: Neurology

## 2018-06-25 ENCOUNTER — Encounter: Payer: Self-pay | Admitting: Neurology

## 2018-06-25 ENCOUNTER — Other Ambulatory Visit: Payer: Self-pay

## 2018-06-25 DIAGNOSIS — G43109 Migraine with aura, not intractable, without status migrainosus: Secondary | ICD-10-CM

## 2018-06-25 DIAGNOSIS — F329 Major depressive disorder, single episode, unspecified: Secondary | ICD-10-CM

## 2018-06-25 DIAGNOSIS — F988 Other specified behavioral and emotional disorders with onset usually occurring in childhood and adolescence: Secondary | ICD-10-CM

## 2018-06-25 DIAGNOSIS — M797 Fibromyalgia: Secondary | ICD-10-CM | POA: Diagnosis not present

## 2018-06-25 DIAGNOSIS — R569 Unspecified convulsions: Secondary | ICD-10-CM

## 2018-06-25 DIAGNOSIS — F32A Depression, unspecified: Secondary | ICD-10-CM

## 2018-06-25 MED ORDER — NORTRIPTYLINE HCL 25 MG PO CAPS: 25.0000 mg | ORAL_CAPSULE | Freq: Every day | ORAL | 5 refills | Status: DC

## 2018-06-25 NOTE — Progress Notes (Signed)
[facility]  PATIENT: Maureen Moore DOB: 09-07-1970  REFERRING CLINICIAN: Brooke Bonito   HISTORICAL  CHIEF COMPLAINT:  Chief Complaint  Patient presents with   Migraine   Seizures    HISTORY OF PRESENT ILLNESS:  Maureen Moore is a 48 y.o. woman with migraine headaches,  ADD, insomnia, myalgias who had a single seizure 04/10/2014.      Update 06/25/2018: Virtual Visit via Video Note I connected with Maureen Moore on 06/25/18 at 11:30 AM EDT by a video enabled telemedicine application and verified that I am speaking with the correct person.  I discussed the limitations of evaluation and management by telemedicine and the availability of in person appointments. The patient expressed understanding and agreed to proceed.  History of Present Illness: She reports her migraines are doing about the same though she did have a more severe cluster of migraines in early April x 5 days when she ran out of topiramate and rizatriptan.     She tried zonisamide but went back to Topamax as it was less effective.    She notes some word finding issues if she tries to write (mor than speak)  and also feels more difficulty doing math.    She has some months without migraines and when they occur they often last 4-5 months (maybe averaging one cluster a month).    When the migraine occurs, she will experience unilateral throbbing pain.  She will typically have photophobia and phonophobia and nausea and sometimes will have vomiting.  Moving will make the pain worse.  Laying down sometimes helps a little bit.  Triptans helped some.  Relpax helps to stop them but she needs to take up to 4 in one day and headache still comes back the next day.   She has not tried any anti-CGRP compounds and we discussed Bernita Raisin  She has not had any more seizures.   She has depression and is on escitalopram.   It is not helping depression much but helps anxiety some.   It helped more  initially.      Observations/Objective: She is a well-developed well-nourished woman in no acute distress.  The head is normocephalic and atraumatic.  Visible skin appears normal.  The sclera are anicteric.  Pharynx and tongue have normal appearance.  She is alert and fully oriented with fluent speech and good attention, knowledge and memory.  Extraocular muscles are intact.  Facial strength and trapezius strength appears normal.  She appeared to have normal strength and coordination in the arms and hands.  Assessment and Plan: Migraine with aura and without status migrainosus, not intractable  Convulsions, unspecified convulsion type (HCC)  Fibromyalgia  Depression, unspecified depression type  Attention deficit disorder (ADD) without hyperactivity  1.  Continue Topamax for migraine 2.  Add nortriptyline at night.  Hopefully this will help the fibromyalgia and migraines and also help the depression some. 3.  We discussed the anti-CGRP agents.  If migraines do not improve further, I will try to get her some samples of Ubrelvy 4.   She will return to see me in 6 months or sooner if there are new or worsening neurologic symptoms.  Follow Up Instructions: I discussed the assessment and treatment plan with the patient. The patient was provided an opportunity to ask questions and all were answered. The patient agreed with the plan and demonstrated an understanding of the instructions.    The patient was advised to call back or seek an in-person evaluation if the symptoms  worsen or if the condition fails to improve as anticipated.  I provided 25 minutes of non-face-to-face time during this encounter.   _______________________ From previous visits Update 12/28/2017: Migraines are more frequent.    She tried to get off of topiramate but migraines were worse so she went back on it.  She was only have 2 migraines a month on high dose topiramate but had trouble tolerating it.   Trokendi was too  expensive.     She has not had any seizures since 04/10/2014 when she had a single seizure while on Wellbutrin.        Mood has been worse and she was switched form citalopram to escitalopram.   She is very tired.       Update 06/12/2017:      She os generally doing well.   She is back on the 100 mg regular topiramate as extended release was very expensive.  She has sometimes had trouble tolerating Topamax, however.  She is now having less than 3 HA's a month.    She uses Maxalt and/or Frova if one occurs.    She does have a headache, they would usually be acute unilateral and associated with photophobia and phonophobia and sometimes nausea.  Moving will worsen the headaches.  Fibromyalgia pain does well on gabapentin.  She tolerates it well.  She is on phentermine for sleepiness and ADD.   Nuvigil also had helped the sleepiness.     She feels that the phentermine helps some but not as well as some stimulants had in the past.  The phentermine seem to help her more first.      She has not had any more seizures.    Her seizure occurred on Wellbutrin.   She felt better on Wellbutrin but due to the seizure, I so not think she should go on it.   From 12/05/2016: Migraine:   She is on topiramate 100 mg and is having 7-8 migraine days a month, usually clustered around her cycle.    She is still having trouble tolerating this dose of topiramate and lower doses did not help.  300 mg helped more but brain fog was severe.  Marland Kitchen.  HA's are daily (30/30 days and  > 4 hrs/day) if she does not take Topamax.   When headache occurs, they are unilateral (either side) and she sometimes gets visual aura.   She gets associated photophobia and phonophobia. Moving worsens the headache. Maxalt will often help the headache but it returns later that day or the next day.  The addition of Frova at the onset of her period has helped.   .   She has been on multiple prophylactic medications for the migraines in the past.  She feels the  higher dose of TPM that helps the migraines is associated with more speech issues and brain fog.      Keppra was not well tolerated.  Zonisamide caused itching and she stopped.       Seizure:   No recent seizure.    She had the single seizure in January 2016 and has not had any further convulsions.  The seizure 04/10/2014 was in the setting of tapering topiramate dose and being on Wellbutrin and being partially sleep deprived.  CT and MRI 04/10/2014 were normal. An EEG was normal.  Fibromyalgia:  Her muscle aches did better on gabapentin and milnacipran but she had a mental fog.   She stopped gabapentin and Savella and she feels more achy  but tolerable.   When present, he worse pain is in the trunk and shoulders.     ADD:   She feels her attention deficit disorder is doing well on phentermine. Daytime sleepiness also improved. Dexmethylphenidate helped a little bit better than the phentermine but other stimulants were not well tolerated.     Nuvigil also helped her and also helped her sleepiness but was not covered.       Mood:   Mood is stable. She is on Celexa with benefit..  She has 2 special needs kids, one with moderately severe autism. And 1 with mild.   Insomnia:  She is sleeping fairly well with much less insomnia.    REVIEW OF SYSTEMS:  Constitutional: No fevers, chills, sweats, or change in appetite.   She has some insomnia.  Eyes: No visual changes, double vision, eye pain Ear, nose and throat: No hearing loss, ear pain, nasal congestion, sore throat Cardiovascular: No chest pain, palpitations Respiratory:  Spome coughing and mild shortness of breath.   No wheezes GastrointestinaI: No nausea, vomiting, diarrhea, abdominal pain, fecal incontinence Genitourinary:  No dysuria, urinary retention or frequency.  She notes nocturia. Musculoskeletal:  Pain in neck, back and many large muscles and some joints Integumentary: No rash, pruritus, skin lesions.  She had some photosensitivity to sulfa  drugs.    Neurological: as above Psychiatric: Some depression.   Better on citalopram. Endocrine: No palpitations, diaphoresis, change in appetite, some cold intolerance Hematologic/Lymphatic:  No anemia, purpura, petechiae. Allergic/Immunologic: She has seasonal allerigies.     ALLERGIES: Allergies  Allergen Reactions   Sulfa Antibiotics     HOME MEDICATIONS:  Current Outpatient Medications:    citalopram (CELEXA) 40 MG tablet, Take 1 tablet (40 mg total) by mouth daily., Disp: 90 tablet, Rfl: 3   fluticasone (FLONASE) 50 MCG/ACT nasal spray, Place into both nostrils daily., Disp: , Rfl:    hydrocortisone cream 1 %, Apply 1 application topically as needed for itching., Disp: , Rfl:    loratadine (CLARITIN) 10 MG tablet, Take 10 mg by mouth daily., Disp: , Rfl:    methylphenidate 36 MG PO CR tablet, TAKE 1 TABLET BY MOUTH ONCE DAILY IN THE MORNING, Disp: , Rfl: 0   nortriptyline (PAMELOR) 25 MG capsule, Take 1 capsule (25 mg total) by mouth at bedtime., Disp: 30 capsule, Rfl: 5   ondansetron (ZOFRAN-ODT) 4 MG disintegrating tablet, Take 1 tablet (4 mg total) by mouth 2 (two) times daily as needed for nausea or vomiting., Disp: 20 tablet, Rfl: 5   rizatriptan (MAXALT) 10 MG tablet, Take 1 tablet (10 mg total) by mouth as needed for migraine. May repeat in 2 hours if needed, Disp: 30 tablet, Rfl: 4   topiramate (TOPAMAX) 100 MG tablet, Take 3 tablets by mouth at bedtime, Disp: 270 tablet, Rfl: 1   PAST MEDICAL HISTORY: Past Medical History:  Diagnosis Date   Fibromyalgia    Headache     PAST SURGICAL HISTORY: No past surgical history on file.  FAMILY HISTORY: Family History  Problem Relation Age of Onset   Migraines Mother    Migraines Father    Migraines Sister    Son had one seizure  SOCIAL HISTORY:  Social History   Socioeconomic History   Marital status: Married    Spouse name: Not on file   Number of children: Not on file   Years of  education: Not on file   Highest education level: Not on file  Occupational History  Not on file  Social Needs   Financial resource strain: Not on file   Food insecurity:    Worry: Not on file    Inability: Not on file   Transportation needs:    Medical: Not on file    Non-medical: Not on file  Tobacco Use   Smoking status: Never Smoker   Smokeless tobacco: Never Used  Substance and Sexual Activity   Alcohol use: Yes    Alcohol/week: 0.0 standard drinks    Comment: social   Drug use: No   Sexual activity: Not on file  Lifestyle   Physical activity:    Days per week: Not on file    Minutes per session: Not on file   Stress: Not on file  Relationships   Social connections:    Talks on phone: Not on file    Gets together: Not on file    Attends religious service: Not on file    Active member of club or organization: Not on file    Attends meetings of clubs or organizations: Not on file    Relationship status: Not on file   Intimate partner violence:    Fear of current or ex partner: Not on file    Emotionally abused: Not on file    Physically abused: Not on file    Forced sexual activity: Not on file  Other Topics Concern   Not on file  Social History Narrative   Not on file     PHYSICAL EXAM  There were no vitals filed for this visit.  There is no height or weight on file to calculate BMI.   General: The patient is well-developed and well-nourished and in no acute distress  Neck/Musculoskeletal: She has mild tenderness in the neck and over some of the classic fibromyalgia tender points.  The neck has a good range of motion.  Neurologic Exam  Mental status: The patient is alert and oriented x 3 at the time of the examination. The patient has apparent normal recent and remote memory, with an apparently normal attention span and concentration ability.   Speech is normal.  Cranial nerves: Extraocular movements are full.  Facial strength and  sensation was normal.  Trapezius strength was normal.  The tongue is midline, and the patient has symmetric elevation of the soft palate.    Motor:  Muscle bulk and tone are normal. Strength is  5 / 5 in all 4 extremities.   Sensory: She has intact sensation to touch and vibration.  Coordination: Cerebellar testing reveals good finger-nose-finger.  Gait and station: Station and gait are normal. Tandem gait is normal.   Reflexes: Deep tendon reflexes are symmetric and normally brisk bilaterally.     DIAGNOSTIC DATA (LABS, IMAGING, TESTING) - I reviewed patient records, labs, notes, testing and imaging myself where available.    ASSESSMENT AND PLAN   Migraine with aura and without status migrainosus, not intractable  Convulsions, unspecified convulsion type (HCC)  Fibromyalgia  Depression, unspecified depression type  Attention deficit disorder (ADD) without hyperactivity   Rabiah Goeser A. Epimenio Foot, MD, PhD 06/25/2018, 2:05 PM Certified in Neurology, Clinical Neurophysiology, Sleep Medicine, Pain Medicine and Neuroimaging  [facility] [address] [place], [place] [address] [phone] r

## 2018-06-29 ENCOUNTER — Telehealth: Payer: Self-pay | Admitting: Neurology

## 2018-06-29 NOTE — Telephone Encounter (Signed)
-----   Message from Asa Lente, MD sent at 06/25/2018  2:11 PM EDT ----- Please schedule follow-up in 6 months

## 2018-06-29 NOTE — Telephone Encounter (Signed)
06/29/18 - LVM to schedule 6 month follow-up w/ Dr. Epimenio Foot

## 2018-09-07 ENCOUNTER — Telehealth: Payer: Self-pay | Admitting: Neurology

## 2018-09-07 ENCOUNTER — Other Ambulatory Visit: Payer: Self-pay | Admitting: Neurology

## 2018-09-07 MED ORDER — TOPIRAMATE 100 MG PO TABS: ORAL_TABLET | ORAL | 1 refills | Status: DC

## 2018-09-07 NOTE — Telephone Encounter (Signed)
Pt has called for a refill on her topiramate (TOPAMAX) 100 MG tablet [facility]  [phone]

## 2018-09-07 NOTE — Addendum Note (Signed)
Addended by: Hope Pigeon on: 09/07/2018 09:39 AM   Modules accepted: Orders

## 2018-09-07 NOTE — Telephone Encounter (Signed)
E-scribed refill as requested. 

## 2018-12-23 ENCOUNTER — Telehealth: Payer: Self-pay | Admitting: Neurology

## 2018-12-23 NOTE — Telephone Encounter (Signed)
Pt called wanting to know if her appt on the 19th can be a VV not a in office appt. Please advise.

## 2018-12-23 NOTE — Telephone Encounter (Signed)
Spoke with Dr. Felecia Shelling. He approved for pt's visit to be virtual. Called pt and updated med list, pharmacy, allergy list. Pt aware she will get message in mychart explaining procedure for VV. Explained she should log on at least 73min prior. She verbalized understanding.

## 2018-12-28 ENCOUNTER — Telehealth (INDEPENDENT_AMBULATORY_CARE_PROVIDER_SITE_OTHER): Payer: 59 | Admitting: Neurology

## 2018-12-28 ENCOUNTER — Encounter: Payer: Self-pay | Admitting: Neurology

## 2018-12-28 ENCOUNTER — Telehealth: Payer: Self-pay | Admitting: *Deleted

## 2018-12-28 DIAGNOSIS — G43109 Migraine with aura, not intractable, without status migrainosus: Secondary | ICD-10-CM

## 2018-12-28 DIAGNOSIS — F988 Other specified behavioral and emotional disorders with onset usually occurring in childhood and adolescence: Secondary | ICD-10-CM | POA: Diagnosis not present

## 2018-12-28 DIAGNOSIS — F329 Major depressive disorder, single episode, unspecified: Secondary | ICD-10-CM

## 2018-12-28 DIAGNOSIS — F32A Depression, unspecified: Secondary | ICD-10-CM

## 2018-12-28 DIAGNOSIS — R569 Unspecified convulsions: Secondary | ICD-10-CM | POA: Diagnosis not present

## 2018-12-28 NOTE — Telephone Encounter (Signed)
Called and spoke with pt. Scheduled 6 month f/u for 06/29/18 at 1:00pm with Dr. Felecia Shelling per MD request.

## 2018-12-28 NOTE — Progress Notes (Signed)
[facility]  PATIENT: Maureen Moore DOB: 1970-10-03  REFERRING CLINICIAN: Reita Cliche   HISTORICAL  CHIEF COMPLAINT:  No chief complaint on file.   HISTORY OF PRESENT ILLNESS:  Maureen Moore is a 48 y.o. woman with migraine headaches,  ADD, insomnia, myalgias who had a single seizure 04/10/2014.      Virtual Visit via Video Note I connected with Maureen Moore on 12/28/18 at 11:30 AM EDT by a video enabled telemedicine application and verified that I am speaking with the correct person.  I discussed the limitations of evaluation and management by telemedicine and the availability of in person appointments. The patient expressed understanding and agreed to proceed.  She reports her migraines are doing the same as last visit with 4 days/month on average, almost always perimenstrual.  She is on topiramate 300 mg with benefit.  She takes Maxalt for breakthrough.    She has noted mild cognitive issues from Topamax.  When the migraine occurs, she will experience unilateral throbbing pain.  She will typically have photophobia and phonophobia and nausea and sometimes will have vomiting.  Moving will make the pain worse.  Laying down sometimes helps a little bit.  Triptans helped some.  Relpax helps to stop them but she needs to take up to 4 in one day and headache still comes back the next day.   She has not had any more seizures.        She is seeing psychiatry again. She has depression. It is not helping depression much but helps anxiety some.   It helped more initially.    She is on methylphenidate CR 54 mg and Buproprion 174 mg (Aplenzin) and Lexapro 20 mg.  She feels it has helped some.     Observations/Objective: She is alert and fully oriented with fluent speech and good attention, knowledge and memory.  Voice is strong  Assessment and Plan: Convulsions, unspecified convulsion type (HCC)  Migraine with aura and without status migrainosus,  not intractable  Attention deficit disorder (ADD) without hyperactivity  Depression, unspecified depression type  1.  Continue Topamax for migraine.   2.  We discussed discussing Aplenzin with psychiatry as it may reduce seizure threshold.  2.  We have discussed the anti-CGRP agents.  If migraines worsen,  I will try to get her some samples of Ubrelvy 3.   She will return to see me in 6 months or sooner if there are new or worsening neurologic symptoms.  Follow Up Instructions: I discussed the assessment and treatment plan with the patient. The patient was provided an opportunity to ask questions and all were answered. The patient agreed with the plan and demonstrated an understanding of the instructions.    The patient was advised to call back or seek an in-person evaluation if the symptoms worsen or if the condition fails to improve as anticipated.  I provided 25 minutes of non-face-to-face time during this encounter.    Update 06/25/2018: She reports her migraines are doing about the same though she did have a more severe cluster of migraines in early April x 5 days when she ran out of topiramate and rizatriptan.     She tried zonisamide but went back to Topamax as it was less effective.    She notes some word finding issues if she tries to write (mor than speak)  and also feels more difficulty doing math.    She has some months without migraines and when they occur they often last 4-5  months (maybe averaging one cluster a month).    When the migraine occurs, she will experience unilateral throbbing pain.  She will typically have photophobia and phonophobia and nausea and sometimes will have vomiting.  Moving will make the pain worse.  Laying down sometimes helps a little bit.  Triptans helped some.  Relpax helps to stop them but she needs to take up to 4 in one day and headache still comes back the next day.   She has not tried any anti-CGRP compounds and we discussed Bernita RaisinUbrelvy  She has not had  any more seizures.   She has depression and is on escitalopram.   It is not helping depression much but helps anxiety some.   It helped more initially.      Update 12/28/2017: Migraines are more frequent.    She tried to get off of topiramate but migraines were worse so she went back on it.  She was only have 2 migraines a month on high dose topiramate but had trouble tolerating it.   Trokendi was too expensive.     She has not had any seizures since 04/10/2014 when she had a single seizure while on Wellbutrin.        Mood has been worse and she was switched form citalopram to escitalopram.   She is very tired.       Update 06/12/2017:      She os generally doing well.   She is back on the 100 mg regular topiramate as extended release was very expensive.  She has sometimes had trouble tolerating Topamax, however.  She is now having less than 3 HA's a month.    She uses Maxalt and/or Frova if one occurs.    She does have a headache, they would usually be acute unilateral and associated with photophobia and phonophobia and sometimes nausea.  Moving will worsen the headaches.  Fibromyalgia pain does well on gabapentin.  She tolerates it well.  She is on phentermine for sleepiness and ADD.   Nuvigil also had helped the sleepiness.     She feels that the phentermine helps some but not as well as some stimulants had in the past.  The phentermine seem to help her more first.      She has not had any more seizures.    Her seizure occurred on Wellbutrin.   She felt better on Wellbutrin but due to the seizure, I so not think she should go on it.   From 12/05/2016: Migraine:   She is on topiramate 100 mg and is having 7-8 migraine days a month, usually clustered around her cycle.    She is still having trouble tolerating this dose of topiramate and lower doses did not help.  300 mg helped more but brain fog was severe.  Marland Kitchen.  HA's are daily (30/30 days and  > 4 hrs/day) if she does not take Topamax.   When headache  occurs, they are unilateral (either side) and she sometimes gets visual aura.   She gets associated photophobia and phonophobia. Moving worsens the headache. Maxalt will often help the headache but it returns later that day or the next day.  The addition of Frova at the onset of her period has helped.   .   She has been on multiple prophylactic medications for the migraines in the past.  She feels the higher dose of TPM that helps the migraines is associated with more speech issues and brain fog.  Keppra was not well tolerated.  Zonisamide caused itching and she stopped.       Seizure:   No recent seizure.    She had the single seizure in January 2016 and has not had any further convulsions.  The seizure 04/10/2014 was in the setting of tapering topiramate dose and being on Wellbutrin and being partially sleep deprived.  CT and MRI 04/10/2014 were normal. An EEG was normal.  Fibromyalgia:  Her muscle aches did better on gabapentin and milnacipran but she had a mental fog.   She stopped gabapentin and Savella and she feels more achy but tolerable.   When present, he worse pain is in the trunk and shoulders.     ADD:   She feels her attention deficit disorder is doing well on phentermine. Daytime sleepiness also improved. Dexmethylphenidate helped a little bit better than the phentermine but other stimulants were not well tolerated.     Nuvigil also helped her and also helped her sleepiness but was not covered.       Mood:   Mood is stable. She is on Celexa with benefit..  She has 2 special needs kids, one with moderately severe autism. And 1 with mild.   Insomnia:  She is sleeping fairly well with much less insomnia.    REVIEW OF SYSTEMS:  Constitutional: No fevers, chills, sweats, or change in appetite.   She has some insomnia.  Eyes: No visual changes, double vision, eye pain Ear, nose and throat: No hearing loss, ear pain, nasal congestion, sore throat Cardiovascular: No chest pain,  palpitations Respiratory:  Spome coughing and mild shortness of breath.   No wheezes GastrointestinaI: No nausea, vomiting, diarrhea, abdominal pain, fecal incontinence Genitourinary:  No dysuria, urinary retention or frequency.  She notes nocturia. Musculoskeletal:  Pain in neck, back and many large muscles and some joints Integumentary: No rash, pruritus, skin lesions.  She had some photosensitivity to sulfa drugs.    Neurological: as above Psychiatric: Some depression.   Better on citalopram. Endocrine: No palpitations, diaphoresis, change in appetite, some cold intolerance Hematologic/Lymphatic:  No anemia, purpura, petechiae. Allergic/Immunologic: She has seasonal allerigies.     ALLERGIES: Allergies  Allergen Reactions   Sulfa Antibiotics     HOME MEDICATIONS:  Current Outpatient Medications:    BuPROPion HBr (APLENZIN) 174 MG TB24, Take 1 tablet by mouth daily., Disp: , Rfl:    escitalopram (LEXAPRO) 20 MG tablet, Take 20 mg by mouth daily., Disp: , Rfl:    fluticasone (FLONASE) 50 MCG/ACT nasal spray, Place into both nostrils daily., Disp: , Rfl:    hydrocortisone cream 1 %, Apply 1 application topically as needed for itching., Disp: , Rfl:    loratadine (CLARITIN) 10 MG tablet, Take 10 mg by mouth daily., Disp: , Rfl:    methylphenidate 54 MG PO CR tablet, Take 54 mg by mouth every morning., Disp: , Rfl:    ondansetron (ZOFRAN-ODT) 4 MG disintegrating tablet, Take 1 tablet (4 mg total) by mouth 2 (two) times daily as needed for nausea or vomiting., Disp: 20 tablet, Rfl: 5   rizatriptan (MAXALT) 10 MG tablet, Take 1 tablet (10 mg total) by mouth as needed for migraine. May repeat in 2 hours if needed, Disp: 30 tablet, Rfl: 4   topiramate (TOPAMAX) 100 MG tablet, Take 3 tablets by mouth at bedtime, Disp: 270 tablet, Rfl: 1   PAST MEDICAL HISTORY: Past Medical History:  Diagnosis Date   Fibromyalgia    Headache  PAST SURGICAL HISTORY: No past surgical  history on file.  FAMILY HISTORY: Family History  Problem Relation Age of Onset   Migraines Mother    Migraines Father    Migraines Sister    Son had one seizure  SOCIAL HISTORY:  Social History   Socioeconomic History   Marital status: Married    Spouse name: Not on file   Number of children: Not on file   Years of education: Not on file   Highest education level: Not on file  Occupational History   Not on file  Social Needs   Financial resource strain: Not on file   Food insecurity    Worry: Not on file    Inability: Not on file   Transportation needs    Medical: Not on file    Non-medical: Not on file  Tobacco Use   Smoking status: Never Smoker   Smokeless tobacco: Never Used  Substance and Sexual Activity   Alcohol use: Yes    Alcohol/week: 0.0 standard drinks    Comment: social   Drug use: No   Sexual activity: Not on file  Lifestyle   Physical activity    Days per week: Not on file    Minutes per session: Not on file   Stress: Not on file  Relationships   Social connections    Talks on phone: Not on file    Gets together: Not on file    Attends religious service: Not on file    Active member of club or organization: Not on file    Attends meetings of clubs or organizations: Not on file    Relationship status: Not on file   Intimate partner violence    Fear of current or ex partner: Not on file    Emotionally abused: Not on file    Physically abused: Not on file    Forced sexual activity: Not on file  Other Topics Concern   Not on file  Social History Narrative   Not on file     PHYSICAL EXAM from last office visit  There were no vitals filed for this visit.  There is no height or weight on file to calculate BMI.   General: The patient is well-developed and well-nourished and in no acute distress  Neck/Musculoskeletal: She has mild tenderness in the neck and over some of the classic fibromyalgia tender points.  The  neck has a good range of motion.  Neurologic Exam  Mental status: The patient is alert and oriented x 3 at the time of the examination. The patient has apparent normal recent and remote memory, with an apparently normal attention span and concentration ability.   Speech is normal.  Cranial nerves: Extraocular movements are full.  Facial strength and sensation was normal.  Trapezius strength was normal.  The tongue is midline, and the patient has symmetric elevation of the soft palate.    Motor:  Muscle bulk and tone are normal. Strength is  5 / 5 in all 4 extremities.   Sensory: She has intact sensation to touch and vibration.  Coordination: Cerebellar testing reveals good finger-nose-finger.  Gait and station: Station and gait are normal. Tandem gait is normal.   Reflexes: Deep tendon reflexes are symmetric and normally brisk bilaterally.      ASSESSMENT AND PLAN   Convulsions, unspecified convulsion type (HCC)  Migraine with aura and without status migrainosus, not intractable  Attention deficit disorder (ADD) without hyperactivity  Depression, unspecified depression type  Atanacio Melnyk A. Epimenio Foot, MD, PhD 12/28/2018, 12:57 PM Certified in Neurology, Clinical Neurophysiology, Sleep Medicine, Pain Medicine and Neuroimaging  [facility] [address] [place], [place] [address] [phone] r

## 2019-02-01 ENCOUNTER — Other Ambulatory Visit: Payer: Self-pay

## 2019-02-01 DIAGNOSIS — Z20822 Contact with and (suspected) exposure to covid-19: Secondary | ICD-10-CM

## 2019-02-03 ENCOUNTER — Telehealth: Payer: Self-pay | Admitting: Internal Medicine

## 2019-02-03 LAB — NOVEL CORONAVIRUS, NAA: SARS-CoV-2, NAA: NOT DETECTED

## 2019-02-03 NOTE — Telephone Encounter (Signed)
Negative COVID results given. Patient results "NOT Detected." Caller expressed understanding. ° °

## 2019-04-27 NOTE — Telephone Encounter (Signed)
Called pt and schedule f/u for tomorrow at 9am. Asked she check in at 830am. She has to bring son with her, no child care available. They passed covid screening and will wear N95 masks. Aware temps will be checked at check in. She will bring updated med list and insurance cards with her.

## 2019-04-28 ENCOUNTER — Ambulatory Visit (INDEPENDENT_AMBULATORY_CARE_PROVIDER_SITE_OTHER): Payer: 59 | Admitting: Neurology

## 2019-04-28 ENCOUNTER — Encounter: Payer: Self-pay | Admitting: Neurology

## 2019-04-28 ENCOUNTER — Other Ambulatory Visit: Payer: Self-pay

## 2019-04-28 VITALS — BP 118/83 | HR 92 | Temp 97.3°F | Ht 60.0 in | Wt 129.5 lb

## 2019-04-28 DIAGNOSIS — R569 Unspecified convulsions: Secondary | ICD-10-CM | POA: Diagnosis not present

## 2019-04-28 DIAGNOSIS — G43709 Chronic migraine without aura, not intractable, without status migrainosus: Secondary | ICD-10-CM

## 2019-04-28 DIAGNOSIS — F988 Other specified behavioral and emotional disorders with onset usually occurring in childhood and adolescence: Secondary | ICD-10-CM | POA: Diagnosis not present

## 2019-04-28 DIAGNOSIS — G4700 Insomnia, unspecified: Secondary | ICD-10-CM | POA: Diagnosis not present

## 2019-04-28 DIAGNOSIS — IMO0002 Reserved for concepts with insufficient information to code with codable children: Secondary | ICD-10-CM

## 2019-04-28 MED ORDER — ELETRIPTAN HYDROBROMIDE 40 MG PO TABS: 40.0000 mg | ORAL_TABLET | ORAL | 1 refills | Status: AC | PRN

## 2019-04-28 MED ORDER — AJOVY 225 MG/1.5ML ~~LOC~~ SOSY: 1.0000 | PREFILLED_SYRINGE | SUBCUTANEOUS | 4 refills | Status: AC

## 2019-04-28 NOTE — Progress Notes (Signed)
[facility]  PATIENT: Maureen Moore DOB: Aug 29, 1970  REFERRING CLINICIAN: Brooke Bonito   HISTORICAL  CHIEF COMPLAINT:  Chief Complaint  Patient presents with  . Follow-up    RM 13 with son (temp:96.8). Last seen 12/28/18. Here to f/u on worsening migraines. She just refilled maxalt and only has 3 pills left for this month. Worried she will not have enough to get her through the rest of the month. Also requesting refill on zofran.     HISTORY OF PRESENT ILLNESS:  Maureen Moore is a 49 y.o. woman with migraine headaches,  ADD, insomnia, myalgias who had a single seizure 04/10/2014.    Update 04/28/2019: She is reporting that her migraine headaches are worse.  At the last visit, these were only occurring 4 days a month, usually perimenstrual, and successfully treated with Maxalt the majority of the time.   However over the last couple months they are occurring near daily (25/30 days) despite Topamax 300 mg for migraine prophylaxis.   They are no longer just occurring peri-menstrually.  She has had a lot of nausea with the migraines.   Hs has photophobia and phonophobia.  She becomes more sensitive to odors.  Moving worsens the pain.   Her neck pain and stiffness is also worse.  Heat sometimes helps the neck pain though she used to prefer cold ice on her forehead.   Maxalt helps but the headache returns.     Besides Topamax, she also has been on amitriptyline, gabapentin, Cymbalta, Savella, Keppra, Zonisamide, flexeril, NSAIDs and these were either ineffective or not tolerated.   Triptans help temporarily but pain often comes back.    Add does well on methylphenidate 54 mg qAM.   Mood is ok on Lexapro.    She has not had any more seizures.  Update  12/28/18 (virtual) She reports her migraines are doing the same as last visit with 4 days/month on average, almost always perimenstrual.  She is on topiramate 300 mg with benefit.  She takes Maxalt for  breakthrough.    She has noted mild cognitive issues from Topamax.  When the migraine occurs, she will experience unilateral throbbing pain.  She will typically have photophobia and phonophobia and nausea and sometimes will have vomiting.  Moving will make the pain worse.  Laying down sometimes helps a little bit.  Triptans helped some.  Relpax helps to stop them but she needs to take up to 4 in one day and headache still comes back the next day.   She has not had any more seizures.        She is seeing psychiatry again. She has depression. It is not helping depression much but helps anxiety some.   It helped more initially.    She is on methylphenidate CR 54 mg and Buproprion 174 mg (Aplenzin) and Lexapro 20 mg.  She feels it has helped some.       Update 06/25/2018: She reports her migraines are doing about the same though she did have a more severe cluster of migraines in early April x 5 days when she ran out of topiramate and rizatriptan.     She tried zonisamide but went back to Topamax as it was less effective.    She notes some word finding issues if she tries to write (mor than speak)  and also feels more difficulty doing math.    She has some months without migraines and when they occur they often last 4-5 months (maybe averaging  one cluster a month).    When the migraine occurs, she will experience unilateral throbbing pain.  She will typically have photophobia and phonophobia and nausea and sometimes will have vomiting.  Moving will make the pain worse.  Laying down sometimes helps a little bit.  Triptans helped some.  Relpax helps to stop them but she needs to take up to 4 in one day and headache still comes back the next day.   She has not tried any anti-CGRP compounds and we discussed Bernita Raisin  She has not had any more seizures.   She has depression and is on escitalopram.   It is not helping depression much but helps anxiety some.   It helped more initially.      Update  12/28/2017: Migraines are more frequent.    She tried to get off of topiramate but migraines were worse so she went back on it.  She was only have 2 migraines a month on high dose topiramate but had trouble tolerating it.   Trokendi was too expensive.     She has not had any seizures since 04/10/2014 when she had a single seizure while on Wellbutrin.        Mood has been worse and she was switched form citalopram to escitalopram.   She is very tired.       Update 06/12/2017:      She os generally doing well.   She is back on the 100 mg regular topiramate as extended release was very expensive.  She has sometimes had trouble tolerating Topamax, however.  She is now having less than 3 HA's a month.    She uses Maxalt and/or Frova if one occurs.    She does have a headache, they would usually be acute unilateral and associated with photophobia and phonophobia and sometimes nausea.  Moving will worsen the headaches.  Fibromyalgia pain does well on gabapentin.  She tolerates it well.  She is on phentermine for sleepiness and ADD.   Nuvigil also had helped the sleepiness.     She feels that the phentermine helps some but not as well as some stimulants had in the past.  The phentermine seem to help her more first.      She has not had any more seizures.    Her seizure occurred on Wellbutrin.   She felt better on Wellbutrin but due to the seizure, I so not think she should go on it.   From 12/05/2016: Migraine:   She is on topiramate 100 mg and is having 7-8 migraine days a month, usually clustered around her cycle.    She is still having trouble tolerating this dose of topiramate and lower doses did not help.  300 mg helped more but brain fog was severe.  Marland Kitchen  HA's are daily (30/30 days and  > 4 hrs/day) if she does not take Topamax.   When headache occurs, they are unilateral (either side) and she sometimes gets visual aura.   She gets associated photophobia and phonophobia. Moving worsens the headache. Maxalt  will often help the headache but it returns later that day or the next day.  The addition of Frova at the onset of her period has helped.   .   She has been on multiple prophylactic medications for the migraines in the past.  She feels the higher dose of TPM that helps the migraines is associated with more speech issues and brain fog.      Keppra was  not well tolerated.  Zonisamide caused itching and she stopped.       Seizure:   No recent seizure.    She had the single seizure in January 2016 and has not had any further convulsions.  The seizure 04/10/2014 was in the setting of tapering topiramate dose and being on Wellbutrin and being partially sleep deprived.  CT and MRI 04/10/2014 were normal. An EEG was normal.  Fibromyalgia:  Her muscle aches did better on gabapentin and milnacipran but she had a mental fog.   She stopped gabapentin and Savella and she feels more achy but tolerable.   When present, he worse pain is in the trunk and shoulders.     ADD:   She feels her attention deficit disorder is doing well on phentermine. Daytime sleepiness also improved. Dexmethylphenidate helped a little bit better than the phentermine but other stimulants were not well tolerated.     Nuvigil also helped her and also helped her sleepiness but was not covered.       Mood:   Mood is stable. She is on Celexa with benefit..  She has 2 special needs kids, one with moderately severe autism. And 1 with mild.   Insomnia:  She is sleeping fairly well with much less insomnia.    REVIEW OF SYSTEMS:  Constitutional: No fevers, chills, sweats, or change in appetite.   She has some insomnia.  Eyes: No visual changes, double vision, eye pain Ear, nose and throat: No hearing loss, ear pain, nasal congestion, sore throat Cardiovascular: No chest pain, palpitations Respiratory:  Spome coughing and mild shortness of breath.   No wheezes GastrointestinaI: No nausea, vomiting, diarrhea, abdominal pain, fecal  incontinence Genitourinary:  No dysuria, urinary retention or frequency.  She notes nocturia. Musculoskeletal:  Pain in neck, back and many large muscles and some joints Integumentary: No rash, pruritus, skin lesions.  She had some photosensitivity to sulfa drugs.    Neurological: as above Psychiatric: Some depression.   Better on citalopram. Endocrine: No palpitations, diaphoresis, change in appetite, some cold intolerance Hematologic/Lymphatic:  No anemia, purpura, petechiae. Allergic/Immunologic: She has seasonal allerigies.     ALLERGIES: Allergies  Allergen Reactions  . Sulfa Antibiotics     HOME MEDICATIONS:  Current Outpatient Medications:  .  BuPROPion HBr (APLENZIN) 174 MG TB24, Take 1 tablet by mouth daily., Disp: , Rfl:  .  escitalopram (LEXAPRO) 20 MG tablet, Take 20 mg by mouth daily., Disp: , Rfl:  .  fluticasone (FLONASE) 50 MCG/ACT nasal spray, Place into both nostrils daily., Disp: , Rfl:  .  hydrocortisone cream 1 %, Apply 1 application topically as needed for itching., Disp: , Rfl:  .  loratadine (CLARITIN) 10 MG tablet, Take 10 mg by mouth as needed. , Disp: , Rfl:  .  methylphenidate 54 MG PO CR tablet, Take 54 mg by mouth every morning., Disp: , Rfl:  .  ondansetron (ZOFRAN-ODT) 4 MG disintegrating tablet, Take 1 tablet (4 mg total) by mouth 2 (two) times daily as needed for nausea or vomiting., Disp: 20 tablet, Rfl: 5 .  rizatriptan (MAXALT) 10 MG tablet, Take 1 tablet (10 mg total) by mouth as needed for migraine. May repeat in 2 hours if needed, Disp: 30 tablet, Rfl: 4 .  topiramate (TOPAMAX) 100 MG tablet, Take 3 tablets by mouth at bedtime, Disp: 270 tablet, Rfl: 1 .  eletriptan (RELPAX) 40 MG tablet, Take 1 tablet (40 mg total) by mouth as needed for migraine or headache. May  repeat in 2 hours if headache persists or recurs., Disp: 10 tablet, Rfl: 1 .  Fremanezumab-vfrm (AJOVY) 225 MG/1.5ML SOSY, Inject 1 Syringe into the skin every 28 (twenty-eight) days.,  Disp: 4.5 mL, Rfl: 4   PAST MEDICAL HISTORY: Past Medical History:  Diagnosis Date  . Fibromyalgia   . Headache     PAST SURGICAL HISTORY: No past surgical history on file.  FAMILY HISTORY: Family History  Problem Relation Age of Onset  . Migraines Mother   . Migraines Father   . Migraines Sister    Son had one seizure  SOCIAL HISTORY:  Social History   Socioeconomic History  . Marital status: Married    Spouse name: Not on file  . Number of children: Not on file  . Years of education: Not on file  . Highest education level: Not on file  Occupational History  . Not on file  Tobacco Use  . Smoking status: Never Smoker  . Smokeless tobacco: Never Used  Substance and Sexual Activity  . Alcohol use: Yes    Alcohol/week: 0.0 standard drinks    Comment: social  . Drug use: No  . Sexual activity: Not on file  Other Topics Concern  . Not on file  Social History Narrative  . Not on file   Social Determinants of Health   Financial Resource Strain:   . Difficulty of Paying Living Expenses: Not on file  Food Insecurity:   . Worried About Programme researcher, broadcasting/film/video in the Last Year: Not on file  . Ran Out of Food in the Last Year: Not on file  Transportation Needs:   . Lack of Transportation (Medical): Not on file  . Lack of Transportation (Non-Medical): Not on file  Physical Activity:   . Days of Exercise per Week: Not on file  . Minutes of Exercise per Session: Not on file  Stress:   . Feeling of Stress : Not on file  Social Connections:   . Frequency of Communication with Friends and Family: Not on file  . Frequency of Social Gatherings with Friends and Family: Not on file  . Attends Religious Services: Not on file  . Active Member of Clubs or Organizations: Not on file  . Attends Banker Meetings: Not on file  . Marital Status: Not on file  Intimate Partner Violence:   . Fear of Current or Ex-Partner: Not on file  . Emotionally Abused: Not on file   . Physically Abused: Not on file  . Sexually Abused: Not on file     PHYSICAL EXAM from last office visit  Vitals:   04/28/19 0859  BP: 118/83  Pulse: 92  Temp: (!) 97.3 F (36.3 C)  Weight: 129 lb 8 oz (58.7 kg)  Height: 5' (1.524 m)    Body mass index is 25.29 kg/m.   General: The patient is well-developed and well-nourished and in no acute distress  Neck/Musculoskeletal: The neck has good range of motion there is mild tenderness.  Also some tenderness over some of the fibromyalgia trigger points..  Neurologic Exam  Mental status: The patient is alert and oriented x 3 at the time of the examination. The patient has apparent normal recent and remote memory, with an apparently normal attention span and concentration ability.   Speech is normal.  Cranial nerves: Extraocular movements are full.  Facial strength and sensation was normal.  Trapezius strength was normal.   Motor:  Muscle bulk and tone are normal. Strength is  5 / 5 in all 4 extremities.   Sensory: She has intact sensation to touch and vibration.  Coordination: Cerebellar testing reveals good finger-nose-finger.  Gait and station: Station and gait are normal. Tandem gait is normal.   Reflexes: Deep tendon reflexes are symmetric.      ASSESSMENT AND PLAN   Chronic migraine  Convulsions, unspecified convulsion type (HCC)  Attention deficit disorder (ADD) without hyperactivity  Insomnia, unspecified type  1.   Due to worsening chronic migraine headaches, I will have her try Ajovy she was given 1 sample pen with 225 mg / 1.5 cc.  Additionally a prescription was sent in.  We will likely have to do prior authorization.  If this is not beneficial consider Botox. 2.   She will continue Topamax but if the headaches improve she can reduce the dose to 200 mg. 3.   Continue other medications through psychiatry. 4.    She will return to see me in 6 months or sooner if there are new or worsening neurologic  symptoms.   Adom Schoeneck A. Epimenio Foot, MD, PhD 04/28/2019, 9:43 AM Certified in Neurology, Clinical Neurophysiology, Sleep Medicine, Pain Medicine and Neuroimaging  [facility] [address], [address] [place], [place] [address] [phone] r

## 2019-04-29 ENCOUNTER — Telehealth: Payer: Self-pay | Admitting: *Deleted

## 2019-04-29 NOTE — Telephone Encounter (Signed)
Initiated PA Ajovy on CMM. Key: [id number] - Rx #: [id number]. Waiting on clinical questions to become available to answer.

## 2019-04-29 NOTE — Telephone Encounter (Signed)
Submitted PA. Waiting on determination from [employer]

## 2019-05-01 ENCOUNTER — Other Ambulatory Visit: Payer: Self-pay | Admitting: Neurology

## 2019-05-03 ENCOUNTER — Other Ambulatory Visit: Payer: Self-pay | Admitting: Neurology

## 2019-05-03 NOTE — Telephone Encounter (Signed)
Received fax from [facility] that PA denied but denial states additional quantities not allowed.  They state pharmacy trying to run it through as 84 days supply but it needs to be ran as 90 days supply. I called pharmacy to let them know. She verbalized understanding. She was able to get claim to go through for 90 days, cost 5.00. She will call pt to let her know. Nothing further needed.

## 2019-06-12 ENCOUNTER — Ambulatory Visit: Payer: 59 | Attending: Internal Medicine

## 2019-06-12 DIAGNOSIS — Z23 Encounter for immunization: Secondary | ICD-10-CM

## 2019-06-12 NOTE — Progress Notes (Signed)
   Covid-19 Vaccination Clinic  Name:  Maureen Moore    MRN: [id number] DOB: 1970/04/04  06/12/2019  [name] was observed post Covid-19 immunization for 15 minutes without incident. She was provided with Vaccine Information Sheet and instruction to access the V-Safe system.   [name] was instructed to call 911 with any severe reactions post vaccine: Marland Kitchen Difficulty breathing  . Swelling of face and throat  . A fast heartbeat  . A bad rash all over body  . Dizziness and weakness   Immunizations Administered    Name Date Dose VIS Date Route   Pfizer COVID-19 Vaccine 06/12/2019  5:10 PM 0.3 mL 02/19/2019 Intramuscular   Manufacturer: [employer], [employer]   Lot: [id number]   NDC: [address]-9

## 2019-06-29 ENCOUNTER — Ambulatory Visit: Payer: Self-pay | Admitting: Neurology

## 2019-07-06 ENCOUNTER — Ambulatory Visit: Payer: 59 | Attending: Internal Medicine

## 2019-07-06 DIAGNOSIS — Z23 Encounter for immunization: Secondary | ICD-10-CM

## 2019-07-06 NOTE — Progress Notes (Signed)
   Covid-19 Vaccination Clinic  Name:  Cierria Height    MRN: [id number] DOB: August 15, 1970  07/06/2019  Ms. Henricks was observed post Covid-19 immunization for 15 minutes without incident. She was provided with Vaccine Information Sheet and instruction to access the V-Safe system.   Ms. Goleman was instructed to call 911 with any severe reactions post vaccine: Marland Kitchen Difficulty breathing  . Swelling of face and throat  . A fast heartbeat  . A bad rash all over body  . Dizziness and weakness   Immunizations Administered    Name Date Dose VIS Date Route   Pfizer COVID-19 Vaccine 07/06/2019 11:37 AM 0.3 mL 05/05/2018 Intramuscular   Manufacturer: [employer], [employer]   Lot: [id number]   NDC: [address]-7

## 2019-08-22 ENCOUNTER — Other Ambulatory Visit: Payer: Self-pay

## 2019-08-23 MED ORDER — TOPIRAMATE 100 MG PO TABS: ORAL_TABLET | ORAL | 0 refills | Status: AC

## 2019-10-27 ENCOUNTER — Ambulatory Visit: Payer: 59 | Admitting: Neurology

## 2019-10-28 ENCOUNTER — Other Ambulatory Visit: Payer: Self-pay | Admitting: Internal Medicine

## 2019-10-28 ENCOUNTER — Other Ambulatory Visit (HOSPITAL_COMMUNITY): Payer: Self-pay | Admitting: Internal Medicine

## 2019-10-28 DIAGNOSIS — Z8249 Family history of ischemic heart disease and other diseases of the circulatory system: Secondary | ICD-10-CM

## 2019-11-11 ENCOUNTER — Ambulatory Visit (HOSPITAL_COMMUNITY): Payer: No Typology Code available for payment source

## 2019-11-30 ENCOUNTER — Other Ambulatory Visit: Payer: Self-pay

## 2019-11-30 ENCOUNTER — Ambulatory Visit (HOSPITAL_COMMUNITY)
Admission: RE | Admit: 2019-11-30 | Discharge: 2019-11-30 | Disposition: A | Discharge status: Home / Self Care | Payer: Self-pay | Source: Ambulatory Visit | Attending: Internal Medicine | Admitting: Internal Medicine

## 2019-11-30 DIAGNOSIS — Z8249 Family history of ischemic heart disease and other diseases of the circulatory system: Secondary | ICD-10-CM | POA: Diagnosis not present

## 2020-05-30 ENCOUNTER — Other Ambulatory Visit: Payer: Self-pay | Admitting: Internal Medicine

## 2020-05-30 DIAGNOSIS — Z1231 Encounter for screening mammogram for malignant neoplasm of breast: Secondary | ICD-10-CM

## 2020-06-19 ENCOUNTER — Other Ambulatory Visit: Payer: Self-pay

## 2020-06-19 ENCOUNTER — Ambulatory Visit
Admission: RE | Admit: 2020-06-19 | Discharge: 2020-06-19 | Disposition: A | Discharge status: Home / Self Care | Payer: No Typology Code available for payment source | Source: Ambulatory Visit | Attending: Internal Medicine | Admitting: Internal Medicine

## 2020-06-19 DIAGNOSIS — Z1231 Encounter for screening mammogram for malignant neoplasm of breast: Secondary | ICD-10-CM | POA: Diagnosis present

## 2021-04-26 ENCOUNTER — Emergency Department
Admission: EM | Admit: 2021-04-26 | Discharge: 2021-04-26 | Disposition: A | Discharge status: Home / Self Care | Payer: No Typology Code available for payment source | Attending: Emergency Medicine | Admitting: Emergency Medicine

## 2021-04-26 ENCOUNTER — Other Ambulatory Visit: Payer: Self-pay

## 2021-04-26 ENCOUNTER — Emergency Department: Payer: No Typology Code available for payment source

## 2021-04-26 DIAGNOSIS — R4182 Altered mental status, unspecified: Secondary | ICD-10-CM | POA: Insufficient documentation

## 2021-04-26 DIAGNOSIS — N3000 Acute cystitis without hematuria: Secondary | ICD-10-CM | POA: Diagnosis not present

## 2021-04-26 LAB — URINE DRUG SCREEN, QUALITATIVE (ARMC ONLY)
Amphetamines, Ur Screen: NOT DETECTED
Barbiturates, Ur Screen: NOT DETECTED
Benzodiazepine, Ur Scrn: NOT DETECTED
Cannabinoid 50 Ng, Ur ~~LOC~~: NOT DETECTED
Cocaine Metabolite,Ur ~~LOC~~: NOT DETECTED
MDMA (Ecstasy)Ur Screen: NOT DETECTED
Methadone Scn, Ur: NOT DETECTED
Opiate, Ur Screen: NOT DETECTED
Phencyclidine (PCP) Ur S: NOT DETECTED
Tricyclic, Ur Screen: NOT DETECTED

## 2021-04-26 LAB — COMPREHENSIVE METABOLIC PANEL
ALT: 19 U/L (ref 0–44)
AST: 22 U/L (ref 15–41)
Albumin: 4.2 g/dL (ref 3.5–5.0)
Alkaline Phosphatase: 88 U/L (ref 38–126)
Anion gap: 8 (ref 5–15)
BUN: 17 mg/dL (ref 6–20)
CO2: 25 mmol/L (ref 22–32)
Calcium: 9.1 mg/dL (ref 8.9–10.3)
Chloride: 103 mmol/L (ref 98–111)
Creatinine, Ser: 1.05 mg/dL — ABNORMAL HIGH (ref 0.44–1.00)
GFR, Estimated: >60 mL/min (ref >60)
Glucose, Bld: 92 mg/dL (ref 70–99)
Potassium: 3.7 mmol/L (ref 3.5–5.1)
Sodium: 136 mmol/L (ref 135–145)
Total Bilirubin: 0.4 mg/dL (ref 0.3–1.2)
Total Protein: 8.7 g/dL — ABNORMAL HIGH (ref 6.5–8.1)

## 2021-04-26 LAB — CBC
HCT: 42.6 % (ref 36.0–46.0)
Hemoglobin: 13.7 g/dL (ref 12.0–15.0)
MCH: 30.8 pg (ref 26.0–34.0)
MCHC: 32.2 g/dL (ref 30.0–36.0)
MCV: 95.7 fL (ref 80.0–100.0)
Platelets: 376 10*3/uL (ref 150–400)
RBC: 4.45 MIL/uL (ref 3.87–5.11)
RDW: 11.9 % (ref 11.5–15.5)
WBC: 5 10*3/uL (ref 4.0–10.5)
nRBC: 0 % (ref 0.0–0.2)

## 2021-04-26 LAB — URINALYSIS, ROUTINE W REFLEX MICROSCOPIC
Bilirubin Urine: NEGATIVE
Glucose, UA: NEGATIVE mg/dL
Hgb urine dipstick: NEGATIVE
Ketones, ur: NEGATIVE mg/dL
Nitrite: NEGATIVE
Protein, ur: NEGATIVE mg/dL
Specific Gravity, Urine: 1.014 (ref 1.005–1.030)
pH: 6 (ref 5.0–8.0)

## 2021-04-26 LAB — POC URINE PREG, ED: Preg Test, Ur: NEGATIVE

## 2021-04-26 MED ORDER — CEFDINIR 300 MG PO CAPS: 300.0000 mg | ORAL_CAPSULE | Freq: Two times a day (BID) | ORAL | 0 refills | Status: AC

## 2021-04-26 MED ORDER — CEFDINIR 300 MG PO CAPS
300.0000 mg | ORAL_CAPSULE | Freq: Once | ORAL | Status: AC
  Administered 2021-04-26: 300 mg via ORAL
  Filled 2021-04-26: qty 1

## 2021-04-26 NOTE — ED Provider Notes (Signed)
[facility] Provider Note   Event Date/Time   First MD Initiated Contact with Patient 04/26/21 1924     (approximate) History  Migraine  HPI Lucca Pickney is a 51 y.o. female with a stated past medical history of migraines who presents for altered mental status.  Patient states that she was trying to order dinner tonight and felt that she could not find the words.  Patient states that she felt like she "was outside of her body looking in" and has consistently felt that she cannot think correctly or form the words that she wants to say.  Patient states that she has had significant migraines in the past but has never had symptoms similar to this.  Patient denies any significant headache at this time.  Patient denies any nausea/vomiting/diarrhea, weakness/numbness/paresthesias in any extremity, syncope, chest pain, shortness of breath, fever/chills, or abdominal pain Physical Exam  Triage Vital Signs: ED Triage Vitals  Enc Vitals Group     BP 04/26/21 1616 (!) 126/94     Pulse Rate 04/26/21 1616 96     Resp 04/26/21 1616 18     Temp 04/26/21 1616 97.9 F (36.6 C)     Temp Source 04/26/21 1616 Oral     SpO2 04/26/21 1616 98 %     Weight 04/26/21 1622 124 lb (56.2 kg)     Height 04/26/21 1622 5' (1.524 m)     Head Circumference --      Peak Flow --      Pain Score 04/26/21 1619 0     Pain Loc --      Pain Edu? --      Excl. in [place]? --    Most recent vital signs: Vitals:   04/26/21 1616  BP: (!) 126/94  Pulse: 96  Resp: 18  Temp: 97.9 F (36.6 C)  SpO2: 98%   General: Awake, oriented x4. CV:  Good peripheral perfusion.  Resp:  Normal effort.  Abd:  No distention.  Other:  Patient sitting in chair in room in no distress.  Patient is alert and oriented x3, extraocular movements intact, cranial nerves II through X intact, negative Romberg, sensation intact in all extremities, no dysdiadochokinesia ED Results / Procedures / Treatments  Labs (all labs  ordered are listed, but only abnormal results are displayed) Labs Reviewed  COMPREHENSIVE METABOLIC PANEL - Abnormal; Notable for the following components:      Result Value   Creatinine, Ser 1.05 (*)    Total Protein 8.7 (*)    All other components within normal limits  URINALYSIS, ROUTINE W REFLEX MICROSCOPIC - Abnormal; Notable for the following components:   Color, Urine AMBER (*)    APPearance CLOUDY (*)    Leukocytes,Ua TRACE (*)    Bacteria, UA MANY (*)    All other components within normal limits  CBC  URINE DRUG SCREEN, QUALITATIVE ([facility] ONLY)  POC URINE PREG, ED   RADIOLOGY ED MD interpretation: CT of the head without contrast interpreted by me and shows no evidence of acute abnormalities including no intracerebral hemorrhage, obvious masses, or significant edema  MRI of the brain without contrast interpreted by me and shows no evidence of acute abnormalities -Agree with radiology assessment Official radiology report(s): CT HEAD WO CONTRAST (5MM)  Result Date: 04/26/2021 CLINICAL DATA:  Right-sided facial numbness, difficulty with speech earlier today EXAM: CT HEAD WITHOUT CONTRAST TECHNIQUE: Contiguous axial images were obtained from the base of the skull through the vertex without intravenous  contrast. RADIATION DOSE REDUCTION: This exam was performed according to the departmental dose-optimization program which includes automated exposure control, adjustment of the mA and/or kV according to patient size and/or use of iterative reconstruction technique. COMPARISON:  None. FINDINGS: Brain: No acute infarct or hemorrhage. Lateral ventricles and midline structures are unremarkable. No acute extra-axial fluid collections. No mass effect. Vascular: No hyperdense vessel or unexpected calcification. Skull: Normal. Negative for fracture or focal lesion. Sinuses/Orbits: Mild mucosal thickening within the ethmoid air cells. Remaining paranasal sinuses are clear. Other: None. IMPRESSION: 1.  No acute intracranial process. Electronically Signed   By: Randa Ngo M.D.   On: 04/26/2021 16:54   MR Brain Wo Contrast (neuro protocol)  Result Date: 04/26/2021 CLINICAL DATA:  Encephalopathy.  Right facial numbness. EXAM: MRI HEAD WITHOUT CONTRAST TECHNIQUE: Multiplanar, multiecho pulse sequences of the brain and surrounding structures were obtained without intravenous contrast. COMPARISON:  04/10/2014 FINDINGS: Brain: No acute infarct, mass effect or extra-axial collection. No acute or chronic hemorrhage. Normal white matter signal, parenchymal volume and CSF spaces. The midline structures are normal. Vascular: Major flow voids are preserved. Skull and upper cervical spine: Normal calvarium and skull base. Visualized upper cervical spine and soft tissues are normal. Sinuses/Orbits:No paranasal sinus fluid levels or advanced mucosal thickening. No mastoid or middle ear effusion. Normal orbits. IMPRESSION: Normal brain MRI. Electronically Signed   By: Ulyses Jarred M.D.   On: 04/26/2021 20:47   PROCEDURES: Critical Care performed: No Procedures MEDICATIONS ORDERED IN ED: Medications  cefdinir (OMNICEF) capsule 300 mg (has no administration in time range)   IMPRESSION / MDM / ASSESSMENT AND PLAN / ED COURSE  I reviewed the triage vital signs and the nursing notes.                             Differential diagnosis includes, but is not limited to, CVA, migraine with aura, urinary tract infection, urosepsis The patient is on the cardiac monitor to evaluate for evidence of arrhythmia and/or significant heart rate changes.  Patient is a 51 year old female who presents for altered mental status in the setting of history of severe migraines.  Patient describes symptoms of of difficulty word finding and "not feeling herself".  Laboratory evaluation does show leukocytes and bacteria on her UA concerning for urinary tract infection.  CT and MRI of the brain shows no evidence of acute abnormality.  CMP  shows no evidence of acute abnormalities.  Urine drug screen negative for all tested.  Pregnancy test negative.  Given the absence of other clinical findings, will treat empirically for urinary tract infection as possible cause of patient's altered mental status.  Patient was encouraged to follow-up with her neurologist, Dr. Jennings Books, if symptoms do not resolve.  The patient has been reexamined and is ready to be discharged.  All diagnostic results have been reviewed and discussed with the patient/family.  Care plan has been outlined and the patient/family understands all current diagnoses, results, and treatment plans.  There are no new complaints, changes, or physical findings at this time.  All questions have been addressed and answered.  All medications, if any, that were given while in the emergency department or any that are being prescribed have been reviewed with the patient/family.  All side effects and adverse reactions have been explained.  Patient was instructed to, and agrees to follow-up with their primary care physician as well as return to the emergency department if any new  or worsening symptoms develop.    FINAL CLINICAL IMPRESSION(S) / ED DIAGNOSES   Final diagnoses:  Altered mental status, unspecified altered mental status type  Acute cystitis without hematuria   Rx / DC Orders   ED Discharge Orders          Ordered    cefdinir (OMNICEF) 300 MG capsule  2 times daily        04/26/21 2107           Note:  This document was prepared using Dragon voice recognition software and may include unintentional dictation errors.   Naaman Plummer, MD 04/26/21 2207

## 2021-04-26 NOTE — ED Triage Notes (Signed)
Pt states that she had tingling on the R side of her face and she had a hard time ordering her food- pt states "cognitively I wasn't there"- pt states she felt off, like she is outside her body- pt states she felt like she had a tight band around her head- pt states she has had an increase in frequency in her cluster headaches- pt is positive for covid

## 2021-04-26 NOTE — ED Notes (Signed)
RN first encounter with pt prior to discharge. Pt verbalizes understanding of discharge instructions, prescriptions, and follow-up care instructions. Pt advised if symptoms worsen or come back to return to ED.

## 2021-04-26 NOTE — ED Provider Triage Note (Signed)
Emergency Medicine Provider Triage Evaluation Note  [facility] , a 51 y.o. female  was evaluated in triage.  Pt complains of right-sided facial numbness, difficulty with speech that began earlier today.  Denies any head trauma, headaches, has a history of migraines and seizure.  She states this morning as she was going to the drive-through she had a hard time getting her words out and has had some brain fog throughout the day, also noticed some numbness along the right side of her face.  She is worried she has had a stroke.  Denies any weakness in the upper or lower extremities.  No vision changes or difficulty swallowing.  No chest pain or shortness of breath.  No history of clotting disorders.  She has been taking some Delsym for cold symptoms, is COVID-positive..  Review of Systems  Positive: Intermittent right-sided facial numbness, difficulty with speech Negative: Chest pain, shortness of breath, fevers dizziness, lightheadedness  Physical Exam  BP (!) 126/94 (BP Location: Left Arm)    Pulse 96    Temp 97.9 F (36.6 C) (Oral)    Resp 18    Ht 5' (1.524 m)    Wt 56.2 kg    SpO2 98%    BMI 24.22 kg/m  Gen:   Awake, no distress   Resp:  Normal effort  MSK:   Moves extremities without difficulty  Other:  Neuro exam cranial nerves II through XII intact.  Patient ambulates with no antalgic gait.  Medical Decision Making  Medically screening exam initiated at 4:43 PM.  Appropriate orders placed.  Tiondra Mckamey was informed that the remainder of the evaluation will be completed by another provider, this initial triage assessment does not replace that evaluation, and the importance of remaining in the ED until their evaluation is complete.  We will order CT of the head and obtain blood work and urinalysis.   Evon Slack, [place] 04/26/21 1646

## 2022-09-13 IMAGING — MR MR HEAD W/O CM
11 series · 48 of 48 positions shown · non-contrast
Comparison: 04/10/2014

CLINICAL DATA: Encephalopathy.  Right facial numbness.

EXAM:
MRI HEAD WITHOUT CONTRAST
TECHNIQUE: Multiplanar, multiecho pulse sequences of the brain and surrounding
structures were obtained without intravenous contrast.

[Series 5: ax dwi_tracew · axial · 3.0mm · 0.65mm/px · z∈[-93,+62]mm · 5 of 48 slices shown]
[im 1/48]
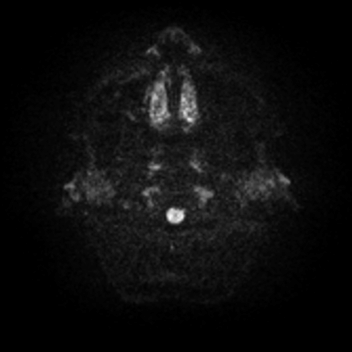
[im 12/48]
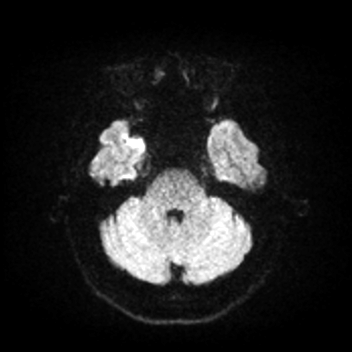
[im 24/48]
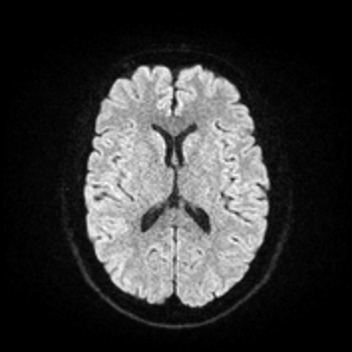
[im 36/48]
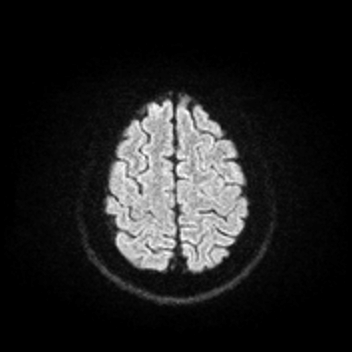
[im 48/48]
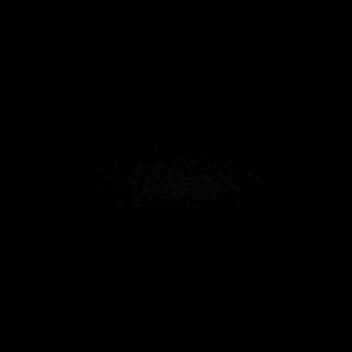

[Series 6: ax dwi_adc · axial · 3.0mm · 0.65mm/px · z∈[-93,+58]mm · 4 of 47 slices shown]
[im 1/47]
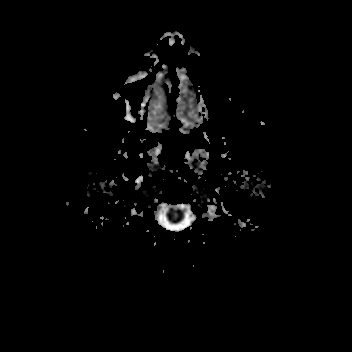
[im 16/47]
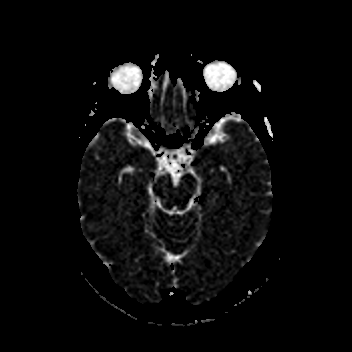
[im 31/47]
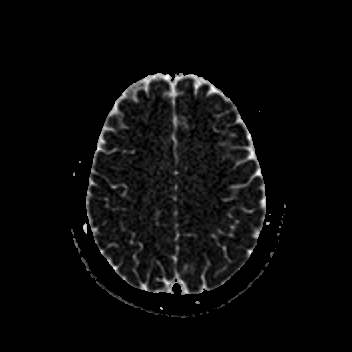
[im 47/47]
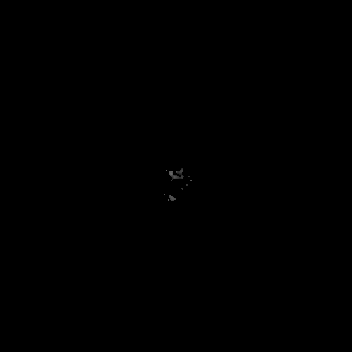

[Series 7: cor dwi_tracew · coronal · 5.0mm · 0.65mm/px · 3 of 39 slices shown]
[im 1/39]
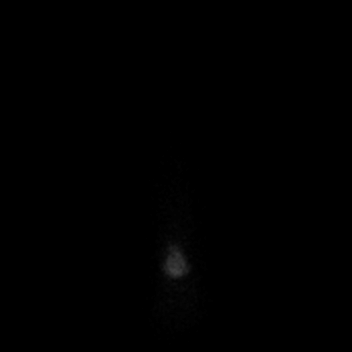
[im 20/39]
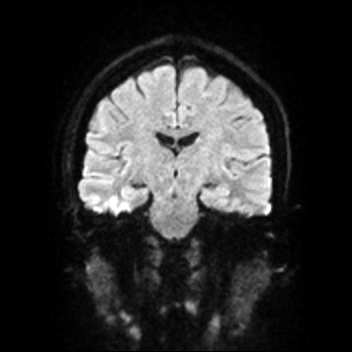
[im 39/39]
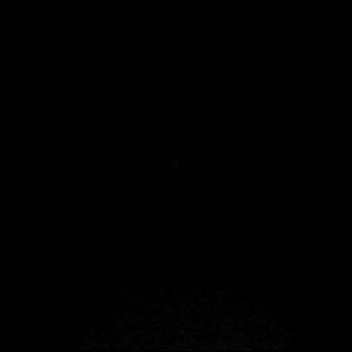

[Series 8: cor dwi_adc · coronal · 5.0mm · 0.65mm/px · 3 of 38 slices shown]
[im 1/38]
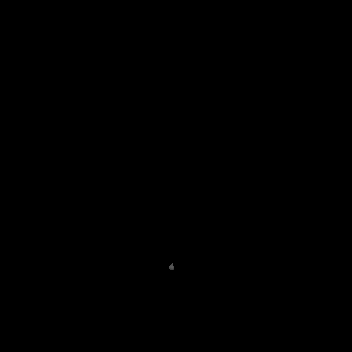
[im 19/38]
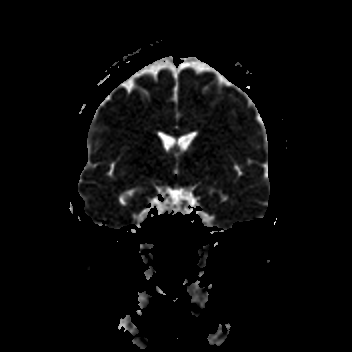
[im 38/38]
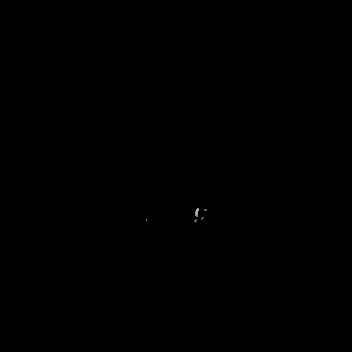

[Series 9: T1 · sagittal · 5.0mm · 0.62mm/px · 2 of 25 slices shown (1 of 2)]
[im 1/25]
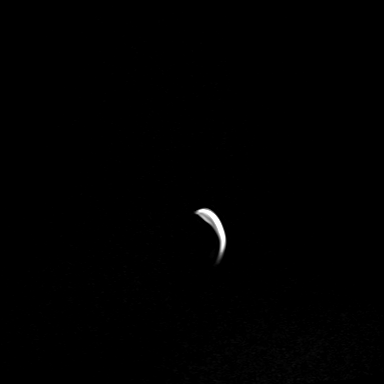
[im 25/25]
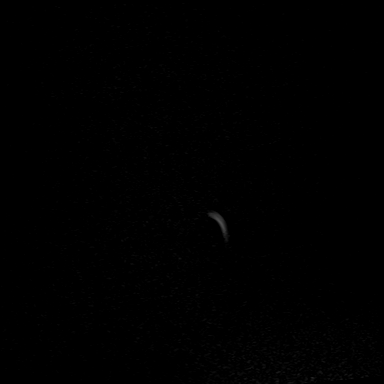

[Series 10: T2 · axial · 5.0mm · 0.53mm/px · z∈[-95,+60]mm · 2 of 27 slices shown (1 of 2)]
[im 1/27]
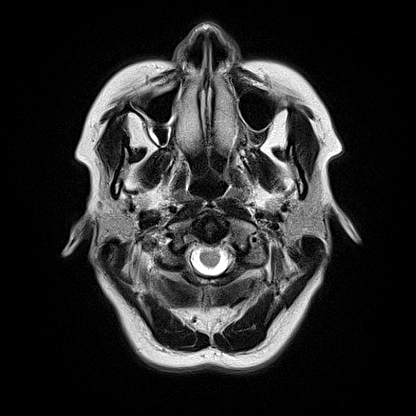
[im 27/27]
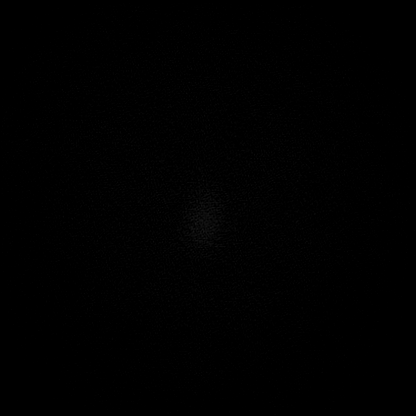

[Series 12: pha_images · axial · 3.0mm · 0.90mm/px · z∈[-106,+71]mm · 4 of 56 slices shown]
[im 1/56]
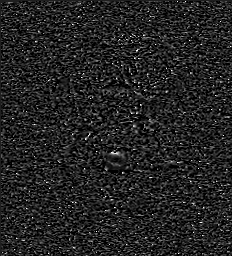
[im 19/56]
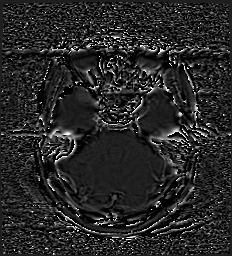
[im 37/56]
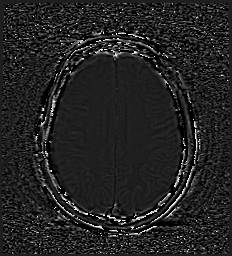
[im 56/56]
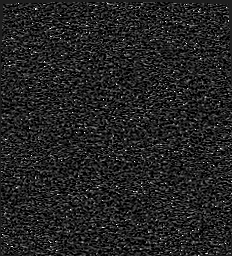

[Series 13: swi_images · axial · 3.0mm · 0.90mm/px · z∈[-106,+71]mm · 5 of 60 slices shown]
[im 1/60]
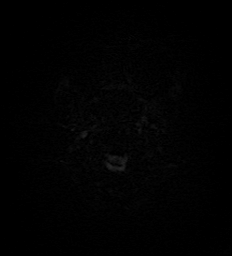
[im 15/60]
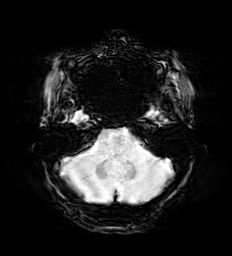
[im 30/60]
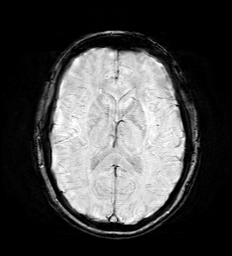
[im 45/60]
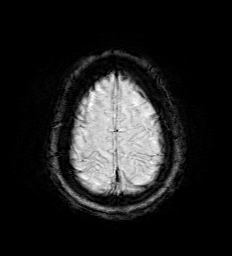
[im 60/60]
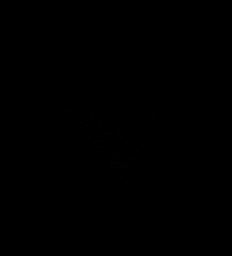

[Series 15: FLAIR · axial · 3.0mm · 0.53mm/px · z∈[-98,+63]mm · 4 of 55 slices shown]
[im 1/55]
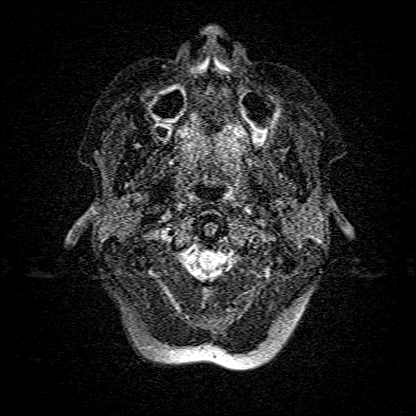
[im 19/55]
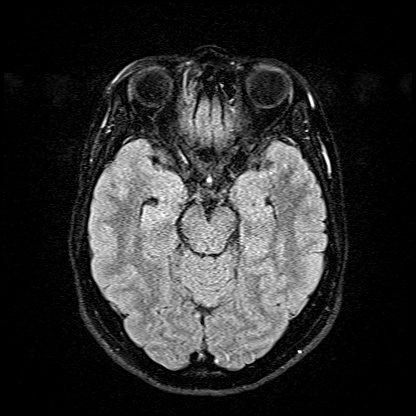
[im 37/55]
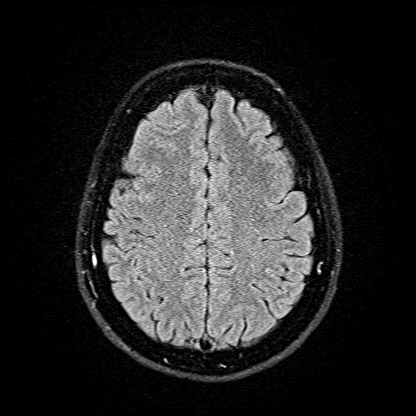
[im 55/55]
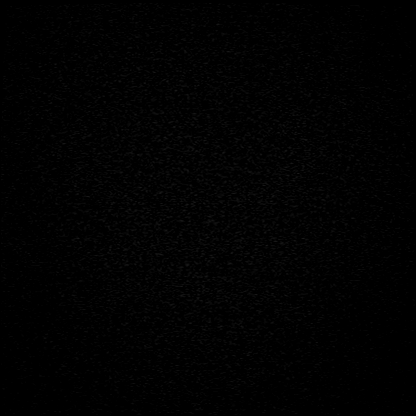

[Series 16: T1 · axial · 1.0mm · 0.98mm/px · z∈[-104,+70]mm · 14 of 175 slices shown (2 of 2)]
[im 1/175]
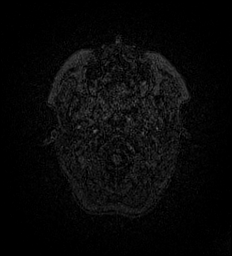
[im 14/175]
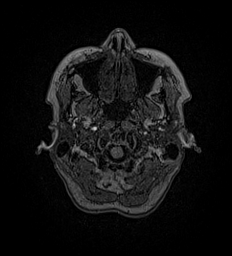
[im 27/175]
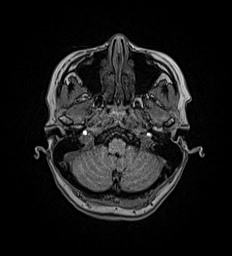
[im 41/175]
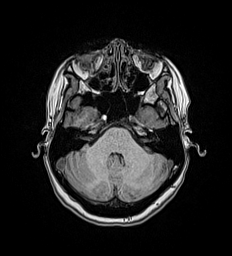
[im 54/175]
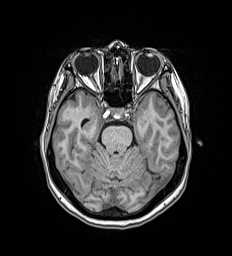
[im 67/175]
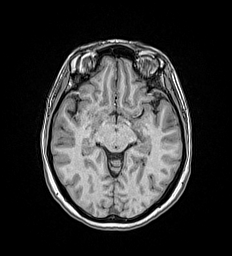
[im 81/175]
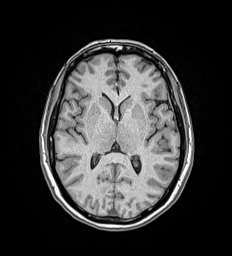
[im 94/175]
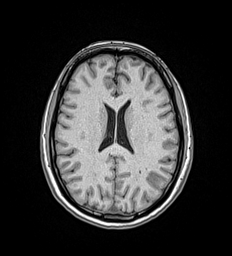
[im 108/175]
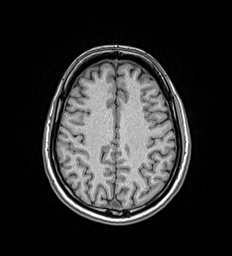
[im 121/175]
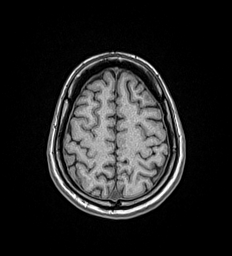
[im 134/175]
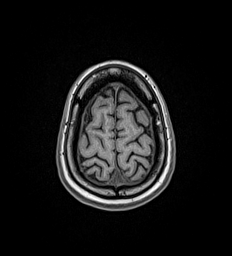
[im 148/175]
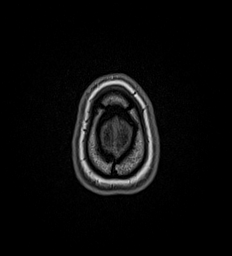
[im 161/175]
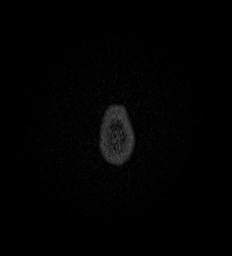
[im 175/175]
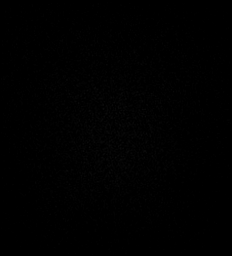

[Series 17: T2 · coronal · 5.0mm · 0.57mm/px · 2 of 29 slices shown (2 of 2)]
[im 1/29]
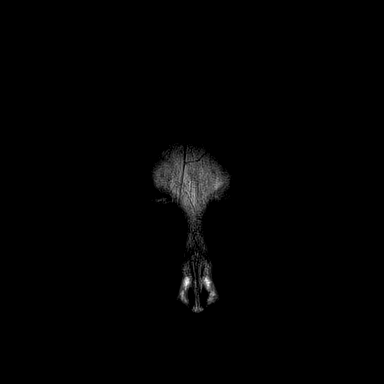
[im 29/29]
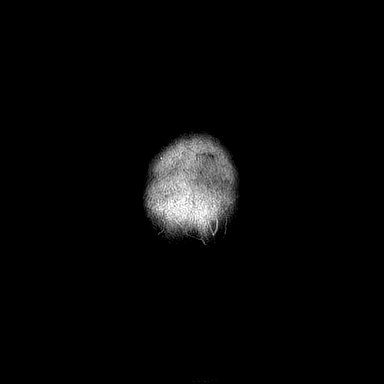

[48 of 48 positions shown; findings below may reference images not displayed]

FINDINGS: Brain: No acute infarct, mass effect or extra-axial collection. No
acute or chronic hemorrhage. Normal white matter signal, parenchymal
volume and CSF spaces. The midline structures are normal.

Vascular: Major flow voids are preserved.

Skull and upper cervical spine: Normal calvarium and skull base.
Visualized upper cervical spine and soft tissues are normal.

Sinuses/Orbits:No paranasal sinus fluid levels or advanced mucosal
thickening. No mastoid or middle ear effusion. Normal orbits.
IMPRESSION: Normal brain MRI.

## 2022-10-15 ENCOUNTER — Encounter (HOSPITAL_BASED_OUTPATIENT_CLINIC_OR_DEPARTMENT_OTHER): Payer: Self-pay | Admitting: Emergency Medicine

## 2022-10-15 ENCOUNTER — Emergency Department (HOSPITAL_BASED_OUTPATIENT_CLINIC_OR_DEPARTMENT_OTHER): Payer: BC Managed Care – PPO

## 2022-10-15 ENCOUNTER — Other Ambulatory Visit: Payer: Self-pay

## 2022-10-15 ENCOUNTER — Emergency Department (HOSPITAL_BASED_OUTPATIENT_CLINIC_OR_DEPARTMENT_OTHER)
Admission: EM | Admit: 2022-10-15 | Discharge: 2022-10-15 | Disposition: A | Discharge status: Home / Self Care | Payer: BC Managed Care – PPO | Attending: Emergency Medicine | Admitting: Emergency Medicine

## 2022-10-15 DIAGNOSIS — X501XXA Overexertion from prolonged static or awkward postures, initial encounter: Secondary | ICD-10-CM | POA: Insufficient documentation

## 2022-10-15 DIAGNOSIS — S93402A Sprain of unspecified ligament of left ankle, initial encounter: Secondary | ICD-10-CM | POA: Diagnosis not present

## 2022-10-15 DIAGNOSIS — M25572 Pain in left ankle and joints of left foot: Secondary | ICD-10-CM | POA: Diagnosis present

## 2022-10-15 NOTE — ED Triage Notes (Signed)
PtPOV c/o ankle pain after rolling it today appx 1300 today.  Reports pain is worsening, unable to bear weight.

## 2022-10-15 NOTE — Discharge Instructions (Addendum)
You likely have an ankle sprain.  You have been placed in a lace up ankle brace today.  Please wear this as needed to give your ankle support and help with pain.  You may wean out of the brace as pain allows.  Avoid movements and activities that are painful in the first couple of days after the injury. Elevate the area of injury help with swelling.  Avoid using anti-inflammatories (NSAIDs) like ibuprofen (Motrin) and naproxen (Aleve) for pain. You may use tylenol as needed (up to 1000mg  every 6 hours).   Gradually return to activities as pain tolerates. Try to engage in non-painful types of physical activity/exercise to increase blood flow to your area of injury.   Pretend to write the ABCs with your foot once daily to help with ankle range of motion. Do not do this if it is painful.  Follow-up with orthopedics if your ankle pain does not seem to be improving over the next 1 to 2 weeks.

## 2022-10-15 NOTE — ED Provider Notes (Signed)
[facility] [facility] AT [facility] Provider Note   CSN: [id number] Arrival date & time: 10/15/22  1814     History  Chief Complaint  Patient presents with   Ankle Injury    Maureen Moore is a 52 y.o. female who presents with concern for left ankle pain.  States she inverted her left ankle approximately 1 PM this afternoon, but did not feel pain until couple hours later. She was able to walk for couple hours immediately after twisting her ankle but now has pain when walking.  Pain is on the lateral side of her ankle.  She denies any swelling of the ankle.   Ankle Injury       Home Medications Prior to Admission medications   Medication Sig Start Date End Date Taking? Authorizing Provider  BuPROPion HBr (APLENZIN) 174 MG TB24 Take 1 tablet by mouth daily.    [provider]  eletriptan (RELPAX) 40 MG tablet Take 1 tablet (40 mg total) by mouth as needed for migraine or headache. May repeat in 2 hours if headache persists or recurs. 04/28/19   Sater, Pearletha Furl, MD  escitalopram (LEXAPRO) 20 MG tablet Take 20 mg by mouth daily.    [provider]  fluticasone (FLONASE) 50 MCG/ACT nasal spray Place into both nostrils daily.    [provider]  Fremanezumab-vfrm (AJOVY) 225 MG/1.5ML SOSY Inject 1 Syringe into the skin every 28 (twenty-eight) days. 04/28/19   Sater, Pearletha Furl, MD  hydrocortisone cream 1 % Apply 1 application topically as needed for itching.    [provider]  loratadine (CLARITIN) 10 MG tablet Take 10 mg by mouth as needed.     [provider]  methylphenidate 54 MG PO CR tablet Take 54 mg by mouth every morning. 12/08/18   [provider]  ondansetron (ZOFRAN-ODT) 4 MG disintegrating tablet Take 1 tablet (4 mg total) by mouth 2 (two) times daily as needed for nausea or vomiting. 06/11/18   Sater, Pearletha Furl, MD  rizatriptan (MAXALT) 10 MG tablet Take 1 tablet (10 mg total) by mouth as needed for  migraine. May repeat in 2 hours if needed 06/11/18   Sater, Pearletha Furl, MD  topiramate (TOPAMAX) 100 MG tablet TAKE 3 TABLETS BY MOUTH ONCE DAILY AT BEDTIME 08/23/19   Sater, Pearletha Furl, MD      Allergies    Sulfa antibiotics    Review of Systems   Review of Systems  Musculoskeletal:        Left ankle pain    Physical Exam Updated Vital Signs BP 110/85 (BP Location: Right Arm)   Pulse 85   Temp 98 F (36.7 C)   Resp 17   Ht 5\' 1"  (1.549 m)   Wt 58.1 kg   SpO2 99%   BMI 24.19 kg/m  Physical Exam Vitals and nursing note reviewed.  Constitutional:      Appearance: Normal appearance.  HENT:     Head: Atraumatic.  Cardiovascular:     Comments: 2+ dorsalis pedis and posterior tibialis pulse Pulmonary:     Effort: Pulmonary effort is normal.  Musculoskeletal:     Comments: Left lower extremity: No obvious deformity, erythema, edema, ecchymosis Able to plantarflex, dorsiflex, invert and evert the foot.  Pain with inversion No tenderness to palpation over the distal tibia or fibula, lateral or medial malleoli, Achilles tendon, first through fifth metatarsals, phalanges Tender to palpation over the ATFL  Neurological:     General: No focal  deficit present.     Mental Status: She is alert.  Psychiatric:        Mood and Affect: Mood normal.        Behavior: Behavior normal.     ED Results / Procedures / Treatments   Labs (all labs ordered are listed, but only abnormal results are displayed) Labs Reviewed - No data to display  EKG None  Radiology DG Ankle Complete Left  Result Date: 10/15/2022 CLINICAL DATA:  Pain after fall EXAM: LEFT ANKLE COMPLETE - 3 VIEW COMPARISON:  None Available. FINDINGS: There is no evidence of fracture, dislocation, or joint effusion. There is no evidence of arthropathy or other focal bone abnormality. Soft tissues are unremarkable. IMPRESSION: No acute osseous abnormality Electronically Signed   By: Karen Kays M.D.   On: 10/15/2022 19:01     Procedures Procedures    Medications Ordered in ED Medications - No data to display  ED Course/ Medical Decision Making/ A&P                                 Medical Decision Making Amount and/or Complexity of Data Reviewed Radiology: ordered.   52 y.o. female presents to the ED for concern of left ankle pain  Differential diagnosis includes but is not limited to high ankle sprain, fracture, dislocation, ankle sprain  ED Course:  Patient presents with concern for left ankle pain that started earlier today after rolling her ankle.  She has pain over the ATFL, no pain anywhere else.  She has full range of motion of the ankle, no obvious swelling, ecchymosis.  X-ray without any osseous abnormalities, joint spaces well-maintained.  Suspect ankle sprain, she was given a lace up ankle brace today to use as needed.  She also states she is having trouble bearing weight on the foot, crutches also provided times as needed.  She was instructed to wean out of these as pain allows.  Instructed on pain control measures and range of motion exercises to with her ankle.  Instructed to follow-up with orthopedics if her symptoms have not improved within the next 1 to 2 weeks.   Impression: Left ankle sprain  Disposition:  The patient was discharged home with instructions to use lace up ankle brace and crutches as needed, Tylenol for pain, follow-up with orthopedics if symptoms not improving within the next 1-2 weeks. Return precautions given.   Imaging Studies ordered: I ordered imaging studies including x ray left ankle  I independently visualized the imaging with scope of interpretation limited to determining acute life threatening conditions related to emergency care. Imaging showed no fractures, dislocations, joint spaces well aligned I agree with the radiologist interpretation           Final Clinical Impression(s) / ED Diagnoses Final diagnoses:  Sprain of left ankle,  unspecified ligament, initial encounter    Rx / DC Orders ED Discharge Orders     None         Arabella Merles, PA-C 10/15/22 1940    Sloan Leiter, DO 10/15/22 2354
